# Patient Record
Sex: Female | Born: 1985 | Hispanic: No | Marital: Single | Smoking: Never smoker
Health system: Southern US, Community
[De-identification: ages and names within clinical notes are randomized; demographics above are authoritative.]

## PROBLEM LIST (undated history)

## (undated) DIAGNOSIS — C801 Malignant (primary) neoplasm, unspecified: Secondary | ICD-10-CM

## (undated) DIAGNOSIS — E079 Disorder of thyroid, unspecified: Secondary | ICD-10-CM

---

## 2015-06-12 HISTORY — PX: TOTAL THYROIDECTOMY: SHX2547

## 2015-06-12 HISTORY — PX: THYROIDECTOMY: SHX17

## 2019-02-20 DIAGNOSIS — Z111 Encounter for screening for respiratory tuberculosis: Secondary | ICD-10-CM | POA: Diagnosis not present

## 2019-02-20 DIAGNOSIS — Z23 Encounter for immunization: Secondary | ICD-10-CM | POA: Diagnosis not present

## 2019-04-01 DIAGNOSIS — E890 Postprocedural hypothyroidism: Secondary | ICD-10-CM | POA: Diagnosis not present

## 2019-05-12 DIAGNOSIS — Z0001 Encounter for general adult medical examination with abnormal findings: Secondary | ICD-10-CM | POA: Diagnosis not present

## 2019-06-19 ENCOUNTER — Other Ambulatory Visit: Payer: Self-pay

## 2019-06-23 ENCOUNTER — Ambulatory Visit: Payer: BC Managed Care – PPO | Admitting: Internal Medicine

## 2019-06-23 ENCOUNTER — Ambulatory Visit
Admission: RE | Admit: 2019-06-23 | Discharge: 2019-06-23 | Disposition: A | Discharge status: Home / Self Care | Payer: BC Managed Care – PPO | Source: Ambulatory Visit | Attending: Internal Medicine | Admitting: Internal Medicine

## 2019-06-23 ENCOUNTER — Encounter: Payer: Self-pay | Admitting: Internal Medicine

## 2019-06-23 VITALS — BP 110/68 | HR 78 | Temp 98.8°F | Ht 59.0 in | Wt 105.6 lb

## 2019-06-23 DIAGNOSIS — Z85850 Personal history of malignant neoplasm of thyroid: Secondary | ICD-10-CM

## 2019-06-23 DIAGNOSIS — L680 Hirsutism: Secondary | ICD-10-CM | POA: Diagnosis not present

## 2019-06-23 DIAGNOSIS — E890 Postprocedural hypothyroidism: Secondary | ICD-10-CM

## 2019-06-23 LAB — T4, FREE: Free T4: 0.96 ng/dL (ref 0.60–1.60)

## 2019-06-23 LAB — COMPREHENSIVE METABOLIC PANEL
ALT: 18 U/L (ref 0–35)
AST: 13 U/L (ref 0–37)
Albumin: 4.5 g/dL (ref 3.5–5.2)
Alkaline Phosphatase: 51 U/L (ref 39–117)
BUN: 13 mg/dL (ref 6–23)
CO2: 26 mEq/L (ref 19–32)
Calcium: 8.7 mg/dL (ref 8.4–10.5)
Chloride: 105 mEq/L (ref 96–112)
Creatinine, Ser: 0.62 mg/dL (ref 0.40–1.20)
GFR: 111.52 mL/min (ref >60.00)
Glucose, Bld: 90 mg/dL (ref 70–99)
Potassium: 3.7 mEq/L (ref 3.5–5.1)
Sodium: 138 mEq/L (ref 135–145)
Total Bilirubin: 0.6 mg/dL (ref 0.2–1.2)
Total Protein: 7.6 g/dL (ref 6.0–8.3)

## 2019-06-23 LAB — TSH: TSH: 8.56 u[IU]/mL — ABNORMAL HIGH (ref 0.35–4.50)

## 2019-06-23 NOTE — Progress Notes (Signed)
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 is a 34 y.o. 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   She is S/P total thyroidectomy in [place] in 2017. She had a left thyroid nodule FNA consistent with papillary thyroid carcinoma. Her pathology report showed right papillary thyroid micro-carcinoma , classic and follicular variant 99991111.3x0.2 cm with no capsular invasion. Left lobe showed a papillary thyroid carcinoma , classic variant 1.0x1.0 x0.8 cm . Four L.N removed and were benign.   A whole body scan I -131 showed remnant thyroid bed with abnormal iodine avid lesion is noted in the mediastinum which could be due to physiologic thymic uptake.  She has been living in the [place] the past [place]. 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    She has a normal ultrasound 6 month ago in [place]    Her TSH has been suppressed on the past few readings through  the student center, she was initially on Levothyroxine 100 mcy daily Mon-Friday , and 150 mcg sat and sun. This was reduced to 100 mcg with repeat TSH still low, apparently her dose was reduced to 88 mcg daily but the patient was not aware of this until I called her with the results with a TSH of 8.56 uIU/ml  And realized she has been taking the 88 mcg .  Today she is c/o palpitations, heat intolerance, no diarrhea.   She feels she has chunks in her throat for the past 2 years.    Sisters with  [employer]    HIRSUTISM HISTORY:  Pt with hirsutism for years. Sisters with similar issues. She had laser hair removal ~ 5 yrs ago. But 2 years ago she noted hair regrowth especially over the chin area. The hair is described as thick and dark. She is currently undergoing laser hair removal again but pt states it has not helped this time.  Pt has noted new onset post-coital bleeding .Uses condoms She has never had a pap smear in the past     HISTORY:   Past Medical History: No past medical history on file.  Past Surgical History: The histories are not reviewed yet. Please review them in the "History" navigator section and refresh this [place].   Social History:  reports that she has never smoked. She has never used smokeless tobacco. She reports current alcohol use.  Family History: family history includes Thyroid disease in her sister.   HOME MEDICATIONS: Allergies as of 06/23/2019   Not on File     Medication List       Accurate as of June 23, 2019 11:59 PM. If you have any questions, ask your nurse or doctor.        levothyroxine 100 MCG tablet Commonly known as: SYNTHROID Take 100 mcg by mouth daily.         REVIEW OF SYSTEMS: A comprehensive ROS was conducted with the patient and is negative except as per HPI and below:  Review of Systems  Constitutional:  Positive for malaise/fatigue and weight loss.  Cardiovascular: Positive for palpitations. Negative for chest pain.  Gastrointestinal: Negative for diarrhea and nausea.  Neurological: Positive for tremors.  Psychiatric/Behavioral: Positive for depression. The patient is nervous/anxious.      OBJECTIVE:  VS: BP 110/68 (BP Location: Right Arm, Patient Position: Sitting, Cuff Size: Normal)   Pulse 78   Temp 98.8 F (37.1 C)   Ht 4\' 11"  (1.499 m)   Wt 105 lb 9.6 oz (47.9 kg)   SpO2 98%   BMI 21.33 kg/m    Wt Readings from Last 3 Encounters:  06/23/19 105 lb 9.6 oz (47.9 kg)     EXAM: General: Pt  appears well and is in NAD  Eyes: External eye exam normal without stare, lid lag or exophthalmos.  EOM intact.    Neck: General: Supple without adenopathy. Thyroid: Thyroid surgically removed.  Lungs: Clear with good BS bilat with no rales, rhonchi, or wheezes  Heart: Auscultation: RRR.  Abdomen: Normoactive bowel sounds, soft, nontender, without masses or organomegaly palpable  Extremities:  BL LE: No pretibial edema normal ROM and strength.  Skin: Hair: Thick dark stubbles around the chin, and beard area. Rest unable to assess due to cosmetic hair removal Skin Inspection: No rashes Skin Palpation: Skin temperature, texture, and thickness normal to palpation  Neuro: Cranial nerves: II - XII grossly intact  Motor: Normal strength throughout DTRs: 2+ and symmetric in UE without delay in relaxation phase  Mental Status: Judgment, insight: Intact Orientation: Oriented to time, place, and person Mood and affect: No depression, anxiety, or agitation     DATA REVIEWED: Results for [place], [facility] (MRN [id number]) as of 06/25/2019 13:09  Ref. Range 06/23/2019 14:48  Sodium Latest Ref Range: 135 - 145 mEq/L 138  Potassium Latest Ref Range: 3.5 - 5.1 mEq/L 3.7  Chloride Latest Ref Range: 96 - 112 mEq/L 105  CO2 Latest Ref Range: 19 - 32 mEq/L 26  Glucose Latest Ref Range: 70 - 99 mg/dL 90  BUN Latest Ref Range: 6 - 23 mg/dL 13  Creatinine Latest Ref Range: 0.40 - 1.20 mg/dL 0.62  Calcium Latest Ref Range: 8.4 - 10.5 mg/dL 8.7  Alkaline Phosphatase Latest Ref Range: 39 - 117 U/L 51  Albumin Latest Ref Range: 3.5 - 5.2 g/dL 4.5  AST Latest Ref Range: 0 - 37 U/L 13  ALT Latest Ref Range: 0 - 35 U/L 18  Total Protein Latest Ref Range: 6.0 - 8.3 g/dL 7.6  Total Bilirubin Latest Ref Range: 0.2 - 1.2 mg/dL 0.6  GFR Latest Ref Range: >60.00 mL/min 111.52  Comment Unknown Pend  TSH Latest Ref Range: 0.35 - 4.50 uIU/mL 8.56 (H)  T4,Free(Direct) Latest Ref Range: 0.60 - 1.60 ng/dL 0.96   Thyroglobulin Latest Units: ng/mL <0.1 (L)  Thyroglobulin Ab Latest Ref Range: < or = 1 IU/mL <1     04/01/2019  TSH 0.26 uIU/mL  FT4  1.5 ng/dL     Right papillary microcarcinoma 123456 mm, follicular variant. Left lobe papillary carcinoma classical variant 10x10x8 mm (T1a, N0,MX)   Thyroid ULtrasound 06/23/2019  FINDINGS: Previous total thyroidectomy. _________________________________________________________  Estimated total number of nodules >/= 1 cm: 0  Number of spongiform nodules >/=  2 cm not described below (TR1): 0  Number of mixed cystic and solid nodules >/= 1.5 cm not described below ([place]): 0  _________________________________________________________  No thyroid bed abnormality demonstrated by ultrasound. No residual thyroid tissue or nodule. Negative for adenopathy.  IMPRESSION: Status post total thyroidectomy.  No residual or significant finding by ultrasound.  ASSESSMENT/PLAN/RECOMMENDATIONS:   1. 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 Postsurgical Hypothyroidism :      Pt with multiple symptoms ,her symptoms are mainly with  Hyperthyroidism  but biochemically she is hypothyroid .  - Pt educated extensively on the correct way to take levothyroxine (first thing in the morning with water, 30 minutes before eating or taking other medications). - Pt encouraged to double dose the following day if she were to miss a dose given long half-life of levothyroxine. - She initially  Though she was on Levothyroxine 100 mcg but when she got home, she realized she has been taking 88 mcg.   - Will restart Levothyroxine 100 mcg daily  - Recheck  in 8 weeks    3.Hirsutism :    - Will proceed with AM labs of testosterone and 17-OH progesterone sometime next week.  - She was also advised that the first line of treatment of hirsutism is COC ( she was on them in the past for ~10 yrs for hirsutism)  - I explained to her that she needs to have a pap smear and have postcoital bleeding checked out prior to starting her on OCp's.    F/U in 6 months   Signed electronically by: Mack Guise, MD  [facility]  [facility] [address] [place],  [address] Phone: [phone] FAX: [phone]   CC: Keith Rake, MD [address] [place] [address] Phone: [phone] Fax: [phone]   Return to Endocrinology clinic as below: Future Appointments  Date Time Provider [place]  07/03/2019  8:45 AM [facility] None  08/21/2019  8:30 AM [facility] None  12/21/2019  9:10 AM Aliannah Holstrom, Melanie Crazier, MD [facility] None

## 2019-06-23 NOTE — Patient Instructions (Signed)
-   Stop Levothyroxine 100 mcg daily  - Start Levothyroxine 88 mcg daily   You are on levothyroxine - which is your thyroid hormone supplement. You MUST take this consistently.  You should take this first thing in the morning on an empty stomach with water. You should not take it with other medications. Wait 61min to 1hr prior to eating. If you are taking any vitamins - please take these in the evening.   If you miss a dose, please take your missed dose the following day (double the dose for that day). You should have a pill box for ONLY levothyroxine on your bedside table to help you remember to take your medications.   - We will set you up for thyroid bed ultrasound

## 2019-06-24 ENCOUNTER — Encounter: Payer: Self-pay | Admitting: Internal Medicine

## 2019-06-24 ENCOUNTER — Telehealth: Payer: Self-pay | Admitting: Internal Medicine

## 2019-06-24 ENCOUNTER — Telehealth: Payer: Self-pay

## 2019-06-24 NOTE — Telephone Encounter (Signed)
Made in error

## 2019-06-24 NOTE — Telephone Encounter (Signed)
Student health calling in for office visit notes for visit on 06/23/2019  Fax number [phone]

## 2019-06-24 NOTE — Telephone Encounter (Signed)
Dr. Kelton Pillar has not signed the visit yet, she is waiting on lab results. I will fax the notes once she has signed off on the visit.

## 2019-06-24 NOTE — Telephone Encounter (Signed)
Tashia,   Please call in levothyroxine 100 mcg to the student pharmacy at [facility]. I could;t find it in the system    Their # [phone]   # 60 with 2 refills   Thanks   Abby Nena Jordan, MD  [facility]  [facility] [address] [place], [place] [address] Phone: [phone] FAX: [phone]

## 2019-06-24 NOTE — Telephone Encounter (Signed)
Patient called in returning a message wasn't sure who gave her a call I didn't see a message in chart    Please advise

## 2019-06-24 NOTE — Telephone Encounter (Signed)
Please advise 

## 2019-06-25 ENCOUNTER — Encounter: Payer: Self-pay | Admitting: Internal Medicine

## 2019-06-25 ENCOUNTER — Other Ambulatory Visit: Payer: Self-pay

## 2019-06-25 DIAGNOSIS — Z85850 Personal history of malignant neoplasm of thyroid: Secondary | ICD-10-CM | POA: Insufficient documentation

## 2019-06-25 DIAGNOSIS — E890 Postprocedural hypothyroidism: Secondary | ICD-10-CM | POA: Insufficient documentation

## 2019-06-25 DIAGNOSIS — L680 Hirsutism: Secondary | ICD-10-CM | POA: Insufficient documentation

## 2019-06-25 LAB — THYROGLOBULIN LEVEL: Thyroglobulin: <0.1 ng/mL — ABNORMAL LOW

## 2019-06-25 LAB — THYROGLOBULIN ANTIBODY: Thyroglobulin Ab: <1 IU/mL (ref <=1)

## 2019-06-25 MED ORDER — LEVOTHYROXINE SODIUM 100 MCG PO TABS: 100.0000 ug | ORAL_TABLET | Freq: Every day | ORAL | 2 refills | Status: DC

## 2019-06-25 NOTE — Telephone Encounter (Signed)
Added pharmacy to pt profile and sent in medication thru e-scribe.

## 2019-07-03 ENCOUNTER — Other Ambulatory Visit: Payer: BC Managed Care – PPO

## 2019-07-07 ENCOUNTER — Telehealth: Payer: Self-pay

## 2019-07-07 NOTE — Telephone Encounter (Signed)
Heather from [facility] (the referring office) is requesting notes be faxed to them from appointment on 06/23/19-please fax to [phone]

## 2019-07-07 NOTE — Telephone Encounter (Signed)
Requested notes faxed

## 2019-07-07 NOTE — Telephone Encounter (Signed)
Attempted to fax notes to # provided and it failed 4 times throughout the day. Need a valid fax # to send notes to.

## 2019-08-21 ENCOUNTER — Other Ambulatory Visit: Payer: BC Managed Care – PPO

## 2019-08-26 ENCOUNTER — Other Ambulatory Visit: Payer: Self-pay

## 2019-08-26 ENCOUNTER — Other Ambulatory Visit (INDEPENDENT_AMBULATORY_CARE_PROVIDER_SITE_OTHER): Payer: BC Managed Care – PPO

## 2019-08-26 DIAGNOSIS — Z85850 Personal history of malignant neoplasm of thyroid: Secondary | ICD-10-CM

## 2019-09-01 LAB — ESTRADIOL: Estradiol: 208 pg/mL

## 2019-09-01 LAB — 17-HYDROXYPROGESTERONE: 17-OH-Progesterone, LC/MS/MS: 344 ng/dL

## 2019-09-01 LAB — HCG, SERUM, QUALITATIVE: Preg, Serum: NEGATIVE

## 2019-09-01 LAB — TESTOSTERONE, TOTAL, LC/MS/MS: Testosterone, Total, LC-MS-MS: 41 ng/dL (ref 2–45)

## 2019-09-01 LAB — PROLACTIN: Prolactin: 15.6 ng/mL

## 2019-09-01 LAB — FSH/LH
FSH: 5.4 m[IU]/mL
LH: 26.3 m[IU]/mL

## 2019-09-07 ENCOUNTER — Telehealth: Payer: Self-pay | Admitting: Internal Medicine

## 2019-09-07 NOTE — Telephone Encounter (Signed)
MEDICATION: levothyroxine  PHARMACY:  [facility], [place] Phone:  [phone]  Fax:  [phone]      IS THIS A 90 DAY SUPPLY : yes  IS PATIENT OUT OF MEDICATION: yes  IF NOT; HOW MUCH IS LEFT:   LAST APPOINTMENT DATE: @1 /26/2021  NEXT APPOINTMENT DATE:@7 /19/2021  DO WE HAVE YOUR PERMISSION TO LEAVE A DETAILED MESSAGE: yes  OTHER COMMENTS:    **Let patient know to contact pharmacy at the end of the day to make sure medication is ready. **  ** Please notify patient to allow 48-72 hours to process**  **Encourage patient to contact the pharmacy for refills or they can request refills through [facility]**

## 2019-09-08 ENCOUNTER — Other Ambulatory Visit: Payer: Self-pay

## 2019-09-08 DIAGNOSIS — Z85850 Personal history of malignant neoplasm of thyroid: Secondary | ICD-10-CM

## 2019-09-08 DIAGNOSIS — E890 Postprocedural hypothyroidism: Secondary | ICD-10-CM

## 2019-09-08 MED ORDER — LEVOTHYROXINE SODIUM 100 MCG PO TABS: 100.0000 ug | ORAL_TABLET | Freq: Every day | ORAL | 2 refills | Status: DC

## 2019-09-08 NOTE — Progress Notes (Signed)
Refill sent to pharmacy.   

## 2019-11-03 ENCOUNTER — Telehealth: Payer: Self-pay | Admitting: Internal Medicine

## 2019-11-03 ENCOUNTER — Other Ambulatory Visit: Payer: Self-pay

## 2019-11-03 DIAGNOSIS — Z85850 Personal history of malignant neoplasm of thyroid: Secondary | ICD-10-CM

## 2019-11-03 DIAGNOSIS — E890 Postprocedural hypothyroidism: Secondary | ICD-10-CM

## 2019-11-03 MED ORDER — LEVOTHYROXINE SODIUM 100 MCG PO TABS: 100.0000 ug | ORAL_TABLET | Freq: Every day | ORAL | 2 refills | Status: DC

## 2019-11-03 NOTE — Telephone Encounter (Signed)
sent 

## 2019-11-03 NOTE — Telephone Encounter (Signed)
Medication Refill Request  Did you call your pharmacy and request this refill first? Yes  . If patient has not contacted pharmacy first, instruct them to do so for future refills.  . Remind them that contacting the pharmacy for their refill is the quickest method to get the refill.  . Refill policy also stated that it will take anywhere between 24-72 hours to receive the refill.    Name of medication? levothyroxine  Is this a 90 day supply? yes  Name and location of pharmacy?   [place], [place] Phone:  [phone]  Fax:  [phone]

## 2019-12-21 ENCOUNTER — Ambulatory Visit: Payer: BC Managed Care – PPO | Admitting: Internal Medicine

## 2019-12-28 ENCOUNTER — Encounter: Payer: Self-pay | Admitting: Internal Medicine

## 2019-12-28 ENCOUNTER — Other Ambulatory Visit: Payer: Self-pay

## 2019-12-28 ENCOUNTER — Ambulatory Visit (INDEPENDENT_AMBULATORY_CARE_PROVIDER_SITE_OTHER): Payer: BC Managed Care – PPO | Admitting: Internal Medicine

## 2019-12-28 VITALS — BP 116/68 | HR 68 | Ht 59.0 in | Wt 105.6 lb

## 2019-12-28 DIAGNOSIS — Z85850 Personal history of malignant neoplasm of thyroid: Secondary | ICD-10-CM | POA: Diagnosis not present

## 2019-12-28 DIAGNOSIS — E890 Postprocedural hypothyroidism: Secondary | ICD-10-CM | POA: Diagnosis not present

## 2019-12-28 DIAGNOSIS — L680 Hirsutism: Secondary | ICD-10-CM | POA: Diagnosis not present

## 2019-12-28 LAB — TSH: TSH: 8.81 u[IU]/mL — ABNORMAL HIGH (ref 0.35–4.50)

## 2019-12-28 MED ORDER — LEVOTHYROXINE SODIUM 112 MCG PO TABS
112.0000 ug | ORAL_TABLET | Freq: Every day | ORAL | 2 refills | Status: DC
Start: 2019-12-28 — End: 2020-06-02

## 2019-12-28 NOTE — Progress Notes (Signed)
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 She had a left thyroid nodule FNA consistent with papillary thyroid carcinoma. Her pathology report showed right papillary thyroid micro-carcinoma , classic and follicular variant 3.4V4.3x0.2 cm with no capsular invasion. Left lobe showed a papillary thyroid carcinoma , classic variant 1.0x1.0 x0.8 cm . Four L.N removed and were benign.   A whole body scan I -131 showed remnant thyroid bed with abnormal iodine avid lesion is noted in the mediastinum which could be due to physiologic thymic uptake.  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 Sisters with similar issues. She had laser hair removal ~ 5 yrs ago. But 2 years ago she noted hair regrowth especially over the chin area. The hair is described as thick and dark. She is currently undergoing laser hair removal again but pt states it has not helped this time.  Pt has noted new onset post-coital bleeding .Uses condoms She has never had a pap smear   SUBJECTIVE:    Today (12/28/2019):  Toni Alvarez is here for a follow up on postsurgical hypothyroidism .  Denies local neck symptoms    Weight is stable  Endorses palpitations, worse in am  No diarrhea    She has an appointment with Gyn in 01/2020    ROS:  As per HPI.   HISTORY:   Past Medical History: No past medical history on file.  Past Surgical History:   Social History:  reports that she has never smoked. She has never used smokeless tobacco. She reports current alcohol use. No history on file for drug use. Family History:  Family History  Problem Relation Age of Onset  . Thyroid disease Sister      HOME MEDICATIONS: Allergies as of 12/28/2019   No Known Allergies     Medication List       Accurate as of December 28, 2019  9:52 AM. If you have any questions, ask your nurse or doctor.        levothyroxine 100 MCG tablet Commonly known as: SYNTHROID Take 1 tablet (100 mcg total) by mouth daily.         OBJECTIVE:   PHYSICAL EXAM: VS: BP 116/68 (BP Location: Left Arm, Patient Position: Sitting, Cuff Size: Normal)   Pulse 68   Ht 4\' 11"  (1.499 m)   Wt 105 lb 9.6 oz (47.9 kg)   LMP 11/30/2019 (Exact Date)   SpO2 99%   BMI 21.33 kg/m    EXAM: General: Pt appears well and is in NAD  Neck: General: Supple without adenopathy. Thyroid:   No goiter or nodules appreciated.   Lungs: Clear with good BS bilat with no rales, rhonchi, or wheezes  Heart: Auscultation: RRR.  Abdomen: Normoactive bowel sounds, soft, nontender, without masses or organomegaly palpable  Extremities:  BL LE: No pretibial edema normal ROM and strength.  Mental Status: Judgment, insight: Intact Orientation: Oriented to time, place, and person Mood and affect: No depression, anxiety, or agitation     DATA REVIEWED: Results for Toni, Alvarez Toni Alvarez (MRN [id number]) as of 12/28/2019 14:29  Ref. Range 06/23/2019 14:48 12/28/2019 10:06  TSH Latest Ref Range: 0.35 - 4.50 uIU/mL 8.56 (H) 8.81 (H)    Results for Toni, Alvarez Toni Alvarez (MRN [id number]) as of 12/28/2019 07:33  Ref. Range 06/23/2019 14:48 08/26/2019 09:38  LH Latest Units: mIU/mL  26.3  FSH Latest Units: mIU/mL  5.4   Prolactin Latest Units: ng/mL  15.6  Glucose Latest Ref Range: 70 - 99 mg/dL 90   Estradiol Latest Units: pg/mL  208  Preg, Serum Unknown  NEGATIVE  Testosterone, Total, LC-MS-MS Latest Ref Range: 2 - 45 ng/dL  41  17-OH-Progesterone, LC/MS/MS Latest Ref Range:  ng/dL  344  TSH Latest Ref Range: 0.35 - 4.50 uIU/mL 8.56 (H)   T4,Free(Direct) Latest Ref Range: 0.60 - 1.60 ng/dL 0.96   Thyroglobulin Latest Units: ng/mL <0.1 (L)   Thyroglobulin Ab Latest Ref Range: < or = 1 IU/mL <1       Thyroid ultrasound 06/23/2019 Status post total thyroidectomy. No residual or significant finding by ultrasound.  ASSESSMENT / PLAN / RECOMMENDATIONS:   1. 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 Postsurgical Hypothyroidism :    - Pt educated extensively on the correct way to take levothyroxine (first thing in the morning with water, 30 minutes before eating or taking other medications). - Pt encouraged to double dose the following day if she were to miss a dose given long half-life of levothyroxine. - TSH above goal , will adjust levothyroxine as below     - Increase Levothyroxine to 112  mcg daily     3.Idiopathic Hirsutism :   - Hormonal profile normal.  - Pt to continue laser hair removal    4. Palpitations:   - Not related to thyroid.  - Could be anxiety - Discussed avoiding caffeine     F/U in 6 months   Signed electronically by: Mack Guise, MD  [facility] Endocrinology  [facility] [address] [place], [place] [address] Phone: [phone] FAX: [phone]      CC: Keith Rake, MD [place]. [place] [address] Phone: [phone]  Fax: [phone]   Return to Endocrinology clinic as below: No future appointments.

## 2019-12-28 NOTE — Patient Instructions (Signed)

## 2019-12-29 LAB — THYROGLOBULIN LEVEL: Thyroglobulin: <0.1 ng/mL — ABNORMAL LOW

## 2019-12-29 LAB — THYROGLOBULIN ANTIBODY: Thyroglobulin Ab: <1 IU/mL (ref <=1)

## 2020-01-13 DIAGNOSIS — Z23 Encounter for immunization: Secondary | ICD-10-CM | POA: Diagnosis not present

## 2020-06-02 ENCOUNTER — Other Ambulatory Visit: Payer: Self-pay | Admitting: Internal Medicine

## 2020-06-30 ENCOUNTER — Other Ambulatory Visit: Payer: Self-pay

## 2020-07-04 ENCOUNTER — Other Ambulatory Visit: Payer: Self-pay

## 2020-07-04 ENCOUNTER — Ambulatory Visit (INDEPENDENT_AMBULATORY_CARE_PROVIDER_SITE_OTHER): Payer: BC Managed Care – PPO | Admitting: Internal Medicine

## 2020-07-04 ENCOUNTER — Encounter: Payer: Self-pay | Admitting: Internal Medicine

## 2020-07-04 VITALS — BP 114/70 | HR 82 | Ht 59.0 in | Wt 109.0 lb

## 2020-07-04 DIAGNOSIS — E890 Postprocedural hypothyroidism: Secondary | ICD-10-CM

## 2020-07-04 DIAGNOSIS — Z85850 Personal history of malignant neoplasm of thyroid: Secondary | ICD-10-CM

## 2020-07-04 LAB — TSH: TSH: 0.06 u[IU]/mL — ABNORMAL LOW (ref 0.35–4.50)

## 2020-07-04 NOTE — Patient Instructions (Signed)

## 2020-07-04 NOTE — Progress Notes (Signed)
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 She had a left thyroid nodule FNA consistent with papillary thyroid carcinoma. Her pathology report showed right papillary thyroid micro-carcinoma , classic and follicular variant 4.0J8.3x0.2 cm with no capsular invasion. Left lobe showed a papillary thyroid carcinoma , classic variant 1.0x1.0 x0.8 cm . Four L.N removed and were benign.   A whole body scan I -131 showed remnant thyroid bed with abnormal iodine avid lesion is noted in the mediastinum which could be due to physiologic thymic uptake.  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 Sisters with similar issues. She had laser hair removal a few years ago with regrowth of hair. LH, FSH, prolactin, testosterone and 17-OH progesterone were all normal.   SUBJECTIVE:    Today (07/04/2020):  Toni Alvarez is here for a follow up on postsurgical hypothyroidism .  Denies local neck symptoms    Weight is stable  No diarrhea  Denies local neck symptoms  LMP end of 05/2020- regular   She has an appointment with [facility] in 01/2020 but did not make it, has an upcoming appointment next week     HISTORY:   Past Medical History: No past medical history on  file.  Past Surgical History:   Social History:  reports that she has never smoked. She has never used smokeless tobacco. She reports current alcohol use. No history on file for drug use. Family History:  Family History  Problem Relation Age of Onset  . Thyroid disease Sister      HOME MEDICATIONS: Allergies as of 07/04/2020   No Known Allergies     Medication List       Accurate as of July 04, 2020  3:56 PM. If you have any questions, ask your nurse or doctor.        levothyroxine 112 MCG tablet Commonly known as: SYNTHROID TAKE 1 TABLET BY MOUTH EVERY DAY         OBJECTIVE:   PHYSICAL EXAM: VS: BP 114/70   Pulse 82   Ht 4\' 11"  (1.499 m)   Wt 109 lb (49.4 kg)   LMP 06/12/2020   SpO2 98%   BMI 22.02 kg/m    EXAM: General: Pt appears well and is in NAD  Neck: General: Supple without adenopathy. Thyroid:   No goiter or nodules appreciated.   Lungs: Clear with good BS bilat with no rales, rhonchi, or wheezes  Heart: Auscultation: RRR.  Abdomen: Normoactive bowel sounds, soft, nontender, without masses or organomegaly palpable  Extremities:  BL LE: No pretibial edema normal ROM and strength.  Mental Status: Judgment, insight: Intact Orientation: Oriented to time, place, and person Mood and affect: No depression, anxiety, or agitation    DATA REVIEWED: Results for Toni Alvarez (MRN [id number]) as of 07/04/2020  16:00  Ref. Range 06/23/2019 14:48 08/26/2019 09:38 12/28/2019 10:06 07/04/2020 09:43  TSH Latest Ref Range: 0.35 - 4.50 uIU/mL 8.56 (H)  8.81 (H) 0.06 (L)     Thyroid ultrasound 06/23/2019 Status post total thyroidectomy. No residual or significant finding by ultrasound.  ASSESSMENT / PLAN / RECOMMENDATIONS:   1. 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 Postsurgical Hypothyroidism :    - Pt educated extensively on the correct way to take levothyroxine (first thing in the morning with water, 30 minutes before eating or taking other medications). - Pt encouraged to double dose the following day if she were to miss a dose given long half-life of levothyroxine. - TSH  Is low , will adjust levothyroxine as below     Levothyroxine 112  mcg , half on Sundays and 1 tablet rest of the week       F/U in 6 months  Labs in 8 weeks   Addendum: discussed labs with the pt on 07/04/2020 at 1600   Signed electronically by: Toni Guise, MD  [facility] Endocrin[facility]  [facility] [address] [place], [place] [address] Phone: [phone] FAX: [phone]      CC: Toni Rake, MD [address]. [place] [address] Phone: [phone]  Fax: [phone]   Return to Endocrinology clinic as below: Future Appointments  Date Time Provider [place]  08/31/2020  8:30 AM [facility] None  01/02/2021  9:10 AM Toni Alvarez, Toni Crazier, MD [facility] None

## 2020-07-05 LAB — THYROGLOBULIN ANTIBODY: Thyroglobulin Ab: <1 IU/mL (ref <=1)

## 2020-07-05 LAB — THYROGLOBULIN LEVEL: Thyroglobulin: <0.1 ng/mL — ABNORMAL LOW

## 2020-08-31 ENCOUNTER — Other Ambulatory Visit: Payer: Self-pay

## 2020-08-31 ENCOUNTER — Other Ambulatory Visit (INDEPENDENT_AMBULATORY_CARE_PROVIDER_SITE_OTHER): Payer: BC Managed Care – PPO

## 2020-08-31 DIAGNOSIS — E890 Postprocedural hypothyroidism: Secondary | ICD-10-CM

## 2020-08-31 LAB — TSH: TSH: 0.44 u[IU]/mL (ref 0.35–4.50)

## 2020-12-02 ENCOUNTER — Other Ambulatory Visit: Payer: Self-pay | Admitting: Internal Medicine

## 2021-01-02 ENCOUNTER — Other Ambulatory Visit: Payer: Self-pay

## 2021-01-02 ENCOUNTER — Ambulatory Visit (INDEPENDENT_AMBULATORY_CARE_PROVIDER_SITE_OTHER): Payer: BC Managed Care – PPO | Admitting: Internal Medicine

## 2021-01-02 ENCOUNTER — Encounter: Payer: Self-pay | Admitting: Internal Medicine

## 2021-01-02 VITALS — BP 110/68 | HR 90 | Ht 59.0 in | Wt 110.4 lb

## 2021-01-02 DIAGNOSIS — Z85850 Personal history of malignant neoplasm of thyroid: Secondary | ICD-10-CM | POA: Diagnosis not present

## 2021-01-02 DIAGNOSIS — E890 Postprocedural hypothyroidism: Secondary | ICD-10-CM

## 2021-01-02 NOTE — Progress Notes (Addendum)
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 is a 35 y.o., female with a past medical history of thyroid cancer , S/P total thyroidectomy . 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 She had a left thyroid nodule FNA consistent with papillary thyroid carcinoma. Her pathology report showed right papillary thyroid micro-carcinoma , classic and follicular variant 99991111.3x0.2 cm with no capsular invasion. Left lobe showed a papillary thyroid carcinoma , classic variant 1.0x1.0 x0.8 cm . Four L.N removed and were benign.    A whole body scan I -131 showed remnant thyroid bed with abnormal iodine avid lesion is noted in the mediastinum which could be due to physiologic thymic uptake.   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 Sisters with similar issues. She had laser hair removal a few years ago with regrowth of hair. LH, FSH, prolactin, testosterone and 17-OH progesterone were all normal.   SUBJECTIVE:    Today (01/02/2021):  [name] is here for a follow up on postsurgical hypothyroidism .  Denies local neck symptoms    Weight is stable  No diarrhea or constipation  Has occasional palpitations  Has occasional neck pain but no swelling  LMP end of 11/19/2020- regular   She is having anxiety issues that sometimes interruptus her sleep   Levothyroxine 112  mcg , half on Sundays and 1  tablet rest of the week     HISTORY:  Past Medical History: No past medical history on file. Past Surgical History:  Social History:  reports that she has never smoked. She has never used smokeless tobacco. She reports current alcohol use. No history on file for drug use. Family History:  Family History  Problem Relation Age of Onset   Thyroid disease Sister      HOME MEDICATIONS: Allergies as of 01/02/2021   No Known Allergies      Medication List        Accurate as of January 02, 2021  9:23 AM. If you have any questions, ask your nurse or doctor.          levothyroxine 112 MCG tablet Commonly known as: SYNTHROID TAKE 1 TABLET BY MOUTH EVERY DAY          OBJECTIVE:   PHYSICAL EXAM: VS: BP 110/68 (BP Location: Left Arm, Patient Position: Sitting, Cuff Size: Normal)   Pulse 90   Ht '4\' 11"'$  (1.499 m)   Wt 110 lb 6.4 oz (50.1 kg)   SpO2 99%   BMI 22.30 kg/m    EXAM: General: Pt appears well and is in NAD  Neck: General: Supple without adenopathy. Thyroid:   No goiter or nodules appreciated.   Lungs: Clear with good BS bilat with no rales, rhonchi, or wheezes  Heart: Auscultation: RRR.  Abdomen: Normoactive bowel sounds, soft, nontender, without masses or organomegaly palpable  Extremities:  BL LE: No pretibial edema normal ROM and strength.  Mental Status: Judgment, insight: Intact  Orientation: Oriented to time, place, and person Mood and affect: No depression, anxiety, or agitation    DATA REVIEWED: 01/02/2021   TSH 0.22 uIU/mL  Tg < 0.1     Thyroid ultrasound 06/23/2019 Status post total thyroidectomy. No residual or significant finding by ultrasound.  ASSESSMENT / PLAN / RECOMMENDATIONS:   Hx of  Papillary thyroid carcinoma: (T1a, N0, Mx ) Stage I      - Pt with excellent response to treatment  - She is at a low risk for recurrence  - Tg and Tg Ab negative in the past  - TSH goal 0.5-2.0 uIU/mL  - Repeat thyroid bed ultrasound is  negative 2021 will repeat this year  - She would [facility], orders were provided today    Medications :  Continue Levothyroxine 112 mcg , half a tablet on sundays and 1 tablet rest of the week      2. Postsurgical Hypothyroidism :      - She is having anxiety and palpitations, that she questions if related to thyroid, will check TSH and proceed from there  - She understands that if her TSH is normal, she will need to check with her PCP about treatment for anxiety     Stop Levothyroxine 112  mcg  Start Levothyroxine 100 mcg daily       F/U in 1 yr   Addendum: [facility] uIu[facility]Ml , will reduce Levothyroxine to 100 mcg daily   Signed electronically by: Mack Guise, MD  [facility]  [address] [place], [place] [address] Phone: [phone] FAX: [phone]      CC: Keith Rake, MD [address] [place] [address] Phone: [phone]  Fax: [phone]   Return to Endocrinology clinic as below: No future appointments.

## 2021-01-03 ENCOUNTER — Encounter: Payer: Self-pay | Admitting: Internal Medicine

## 2021-01-03 MED ORDER — LEVOTHYROXINE SODIUM 100 MCG PO TABS: 100.0000 ug | ORAL_TABLET | Freq: Every day | ORAL | 3 refills | Status: DC

## 2021-01-03 NOTE — Addendum Note (Signed)
Addended by: Dorita Sciara on: 01/03/2021 01:39 PM   Modules accepted: Orders

## 2021-03-01 ENCOUNTER — Encounter: Payer: Self-pay | Admitting: Internal Medicine

## 2021-06-16 IMAGING — US US THYROID
1 series · 14 of 19 positions shown · non-contrast
Comparison: None.

CLINICAL DATA: History of papillary thyroid carcinoma. Status post
thyroidectomy.

EXAM:
THYROID ULTRASOUND
TECHNIQUE: Ultrasound examination of the thyroid gland and adjacent soft
tissues was performed.

[Series 1: us thyroid · 0.04mm/px · 14 of 19 slices shown]
[im 1/19]
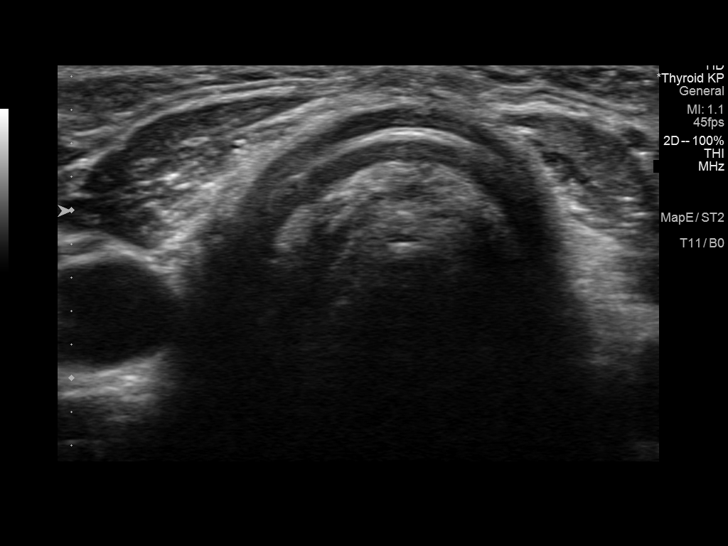
[im 3/19]
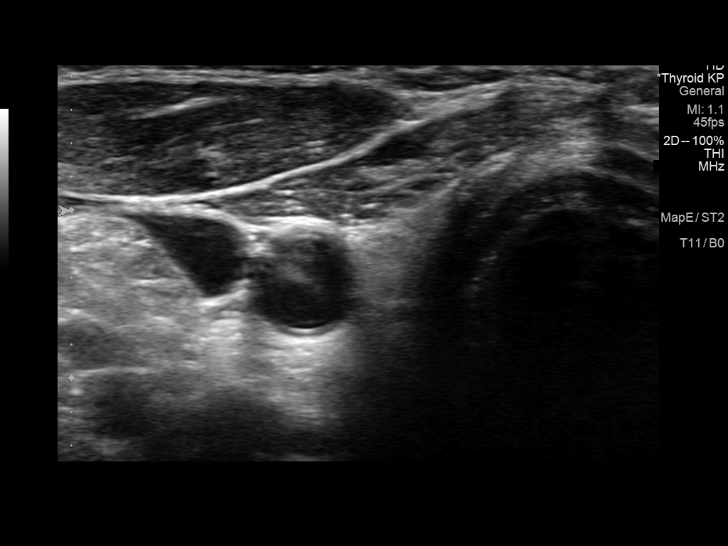
[im 4/19]
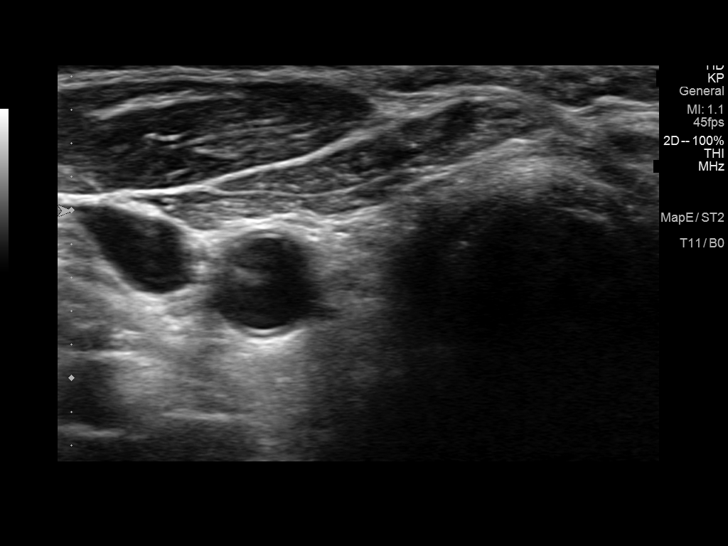
[im 5/19]
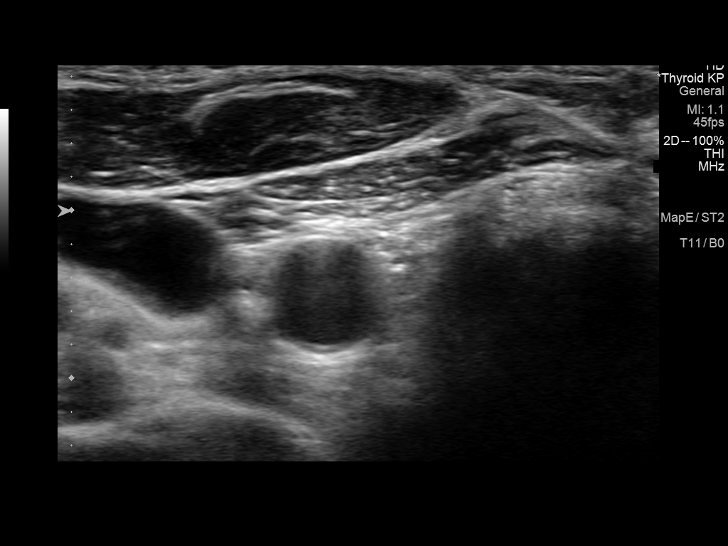
[im 7/19]
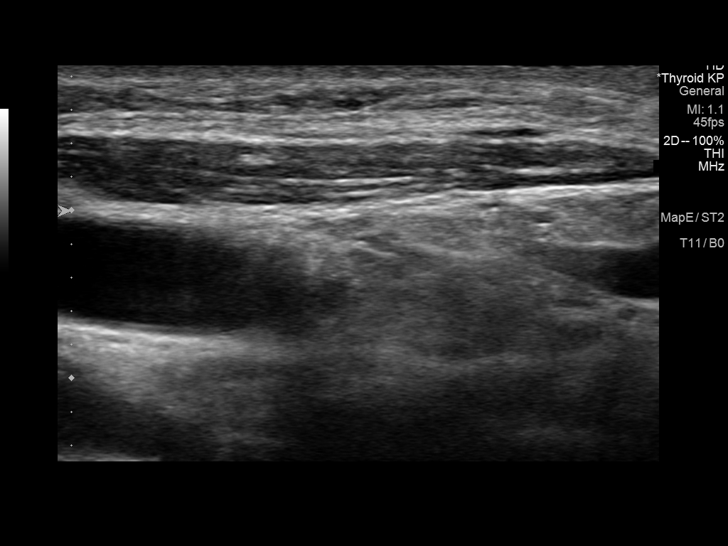
[im 8/19]
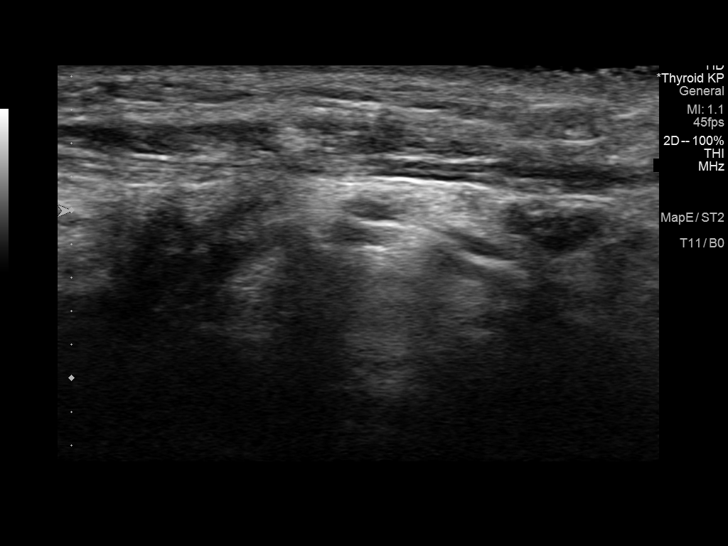
[im 9/19]
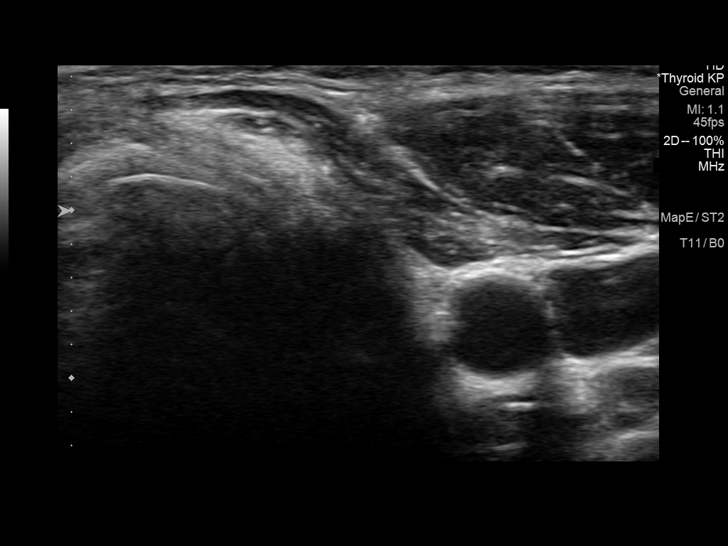
[im 11/19]
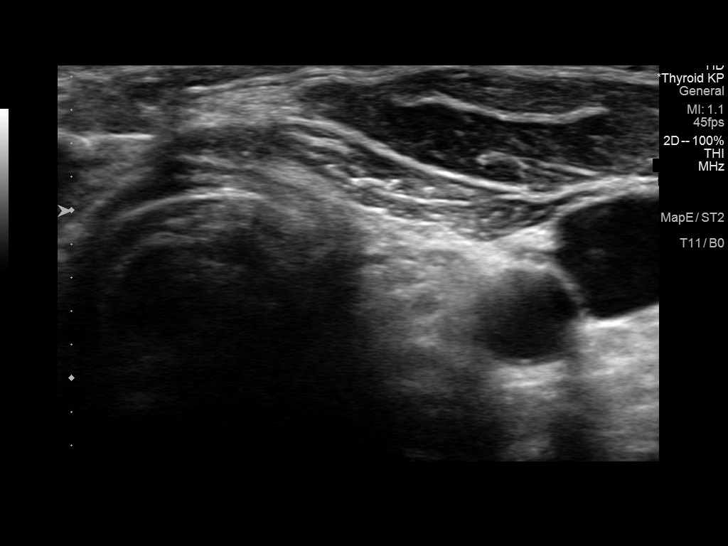
[im 12/19]
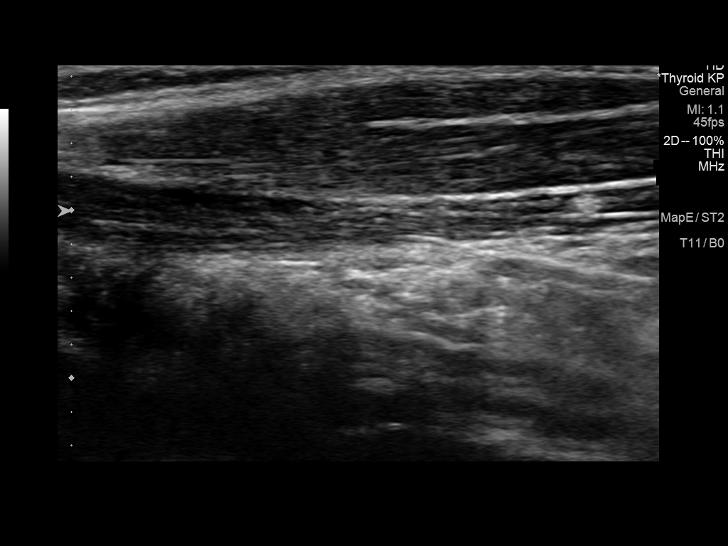
[im 13/19]
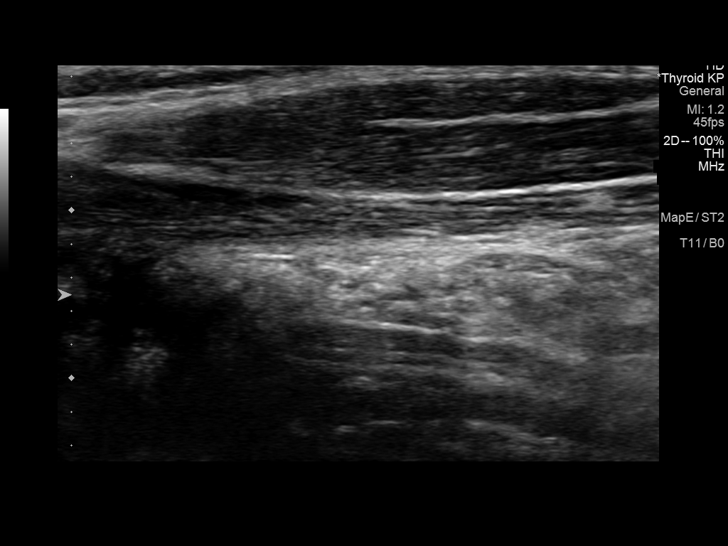
[im 15/19]
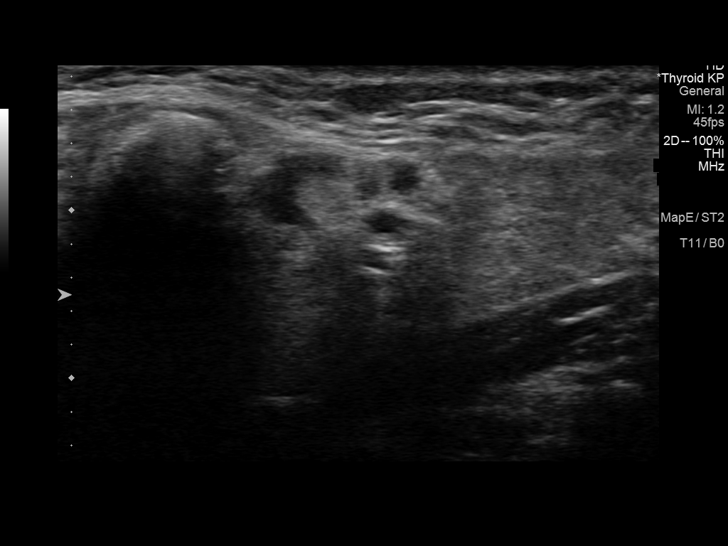
[im 16/19]
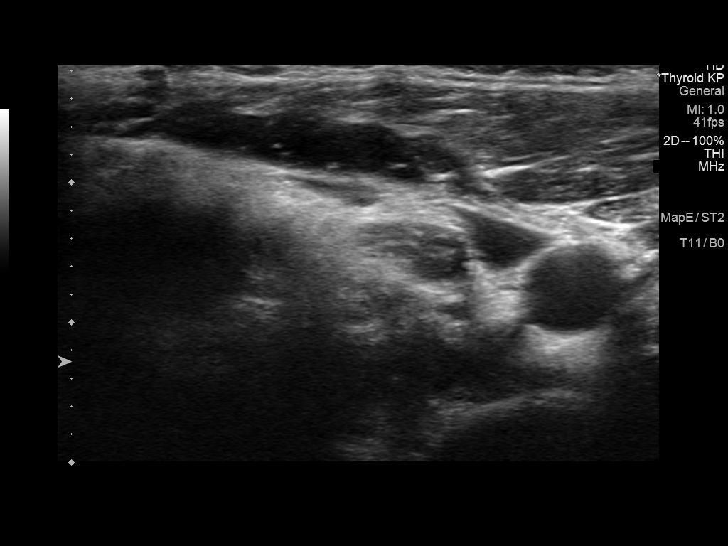
[im 17/19]
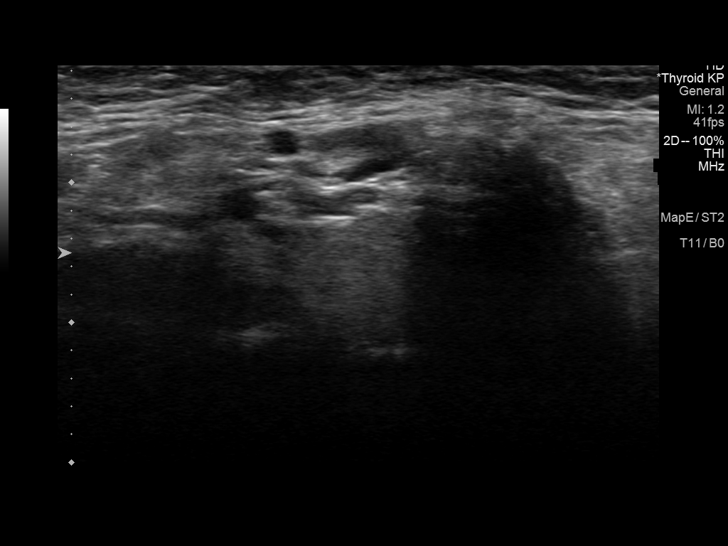
[im 19/19]
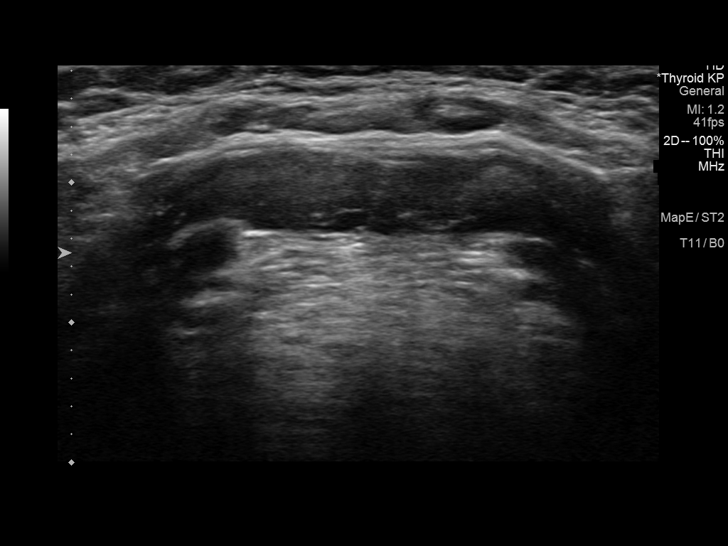

[14 of 19 positions shown; findings below may reference images not displayed]

FINDINGS: Previous total thyroidectomy.
_________________________________________________________

Estimated total number of nodules >/= 1 cm: 0

Number of spongiform nodules >/=  2 cm not described below (TR1): 0

Number of mixed cystic and solid nodules >/= 1.5 cm not described
below (TR2): 0

_________________________________________________________

No thyroid bed abnormality demonstrated by ultrasound. No residual
thyroid tissue or nodule. Negative for adenopathy.
IMPRESSION: Status post total thyroidectomy. No residual or significant finding
by ultrasound.

The above is in keeping with the ACR TI-RADS recommendations - [HOSPITAL] 9111;[DATE].

## 2021-07-31 ENCOUNTER — Telehealth: Payer: Self-pay

## 2021-07-31 NOTE — Telephone Encounter (Signed)
Vm left and my chart message sent to patient

## 2021-07-31 NOTE — Telephone Encounter (Signed)
Patient had some testing done and the results say she a blood clot on  her lymph node. She wants to know if anything needs to be done. She is having some pain in the area where her lymph nodes are.

## 2021-09-11 ENCOUNTER — Other Ambulatory Visit: Payer: BC Managed Care – PPO

## 2021-10-28 ENCOUNTER — Encounter (HOSPITAL_BASED_OUTPATIENT_CLINIC_OR_DEPARTMENT_OTHER): Payer: Self-pay | Admitting: Emergency Medicine

## 2021-10-28 ENCOUNTER — Other Ambulatory Visit: Payer: Self-pay

## 2021-10-28 DIAGNOSIS — R509 Fever, unspecified: Secondary | ICD-10-CM | POA: Diagnosis present

## 2021-10-28 DIAGNOSIS — U071 COVID-19: Secondary | ICD-10-CM | POA: Insufficient documentation

## 2021-10-28 DIAGNOSIS — R000 Tachycardia, unspecified: Secondary | ICD-10-CM | POA: Insufficient documentation

## 2021-10-28 NOTE — ED Triage Notes (Signed)
Pt inhaled chemical 1-ethyle-3 dimethylaminopropyl on Thursday. C/o fever and nasal congestion.

## 2021-10-29 ENCOUNTER — Emergency Department (HOSPITAL_BASED_OUTPATIENT_CLINIC_OR_DEPARTMENT_OTHER): Payer: No Typology Code available for payment source

## 2021-10-29 ENCOUNTER — Emergency Department (HOSPITAL_BASED_OUTPATIENT_CLINIC_OR_DEPARTMENT_OTHER)
Admission: EM | Admit: 2021-10-29 | Discharge: 2021-10-29 | Disposition: A | Discharge status: Home / Self Care | Payer: No Typology Code available for payment source | Attending: Emergency Medicine | Admitting: Emergency Medicine

## 2021-10-29 DIAGNOSIS — R6883 Chills (without fever): Secondary | ICD-10-CM

## 2021-10-29 DIAGNOSIS — R051 Acute cough: Secondary | ICD-10-CM

## 2021-10-29 DIAGNOSIS — R0981 Nasal congestion: Secondary | ICD-10-CM

## 2021-10-29 DIAGNOSIS — R110 Nausea: Secondary | ICD-10-CM

## 2021-10-29 DIAGNOSIS — U071 COVID-19: Secondary | ICD-10-CM | POA: Diagnosis not present

## 2021-10-29 HISTORY — DX: Malignant (primary) neoplasm, unspecified: C80.1

## 2021-10-29 HISTORY — DX: Disorder of thyroid, unspecified: E07.9

## 2021-10-29 LAB — RESP PANEL BY RT-PCR (FLU A&B, COVID) ARPGX2
Influenza A by PCR: NEGATIVE
Influenza B by PCR: NEGATIVE
SARS Coronavirus 2 by RT PCR: POSITIVE — AB

## 2021-10-29 MED ORDER — ONDANSETRON HCL 4 MG PO TABS: 4.0000 mg | ORAL_TABLET | Freq: Three times a day (TID) | ORAL | 0 refills | Status: DC | PRN

## 2021-10-29 NOTE — ED Provider Notes (Signed)
Toni Alvarez   CSN: [id number] Arrival date & time: 10/28/21  2324     History  Chief Complaint  Patient presents with   Chemical Exposure    Toni Alvarez is a 36 y.o. female.  The history is provided by the patient and medical records. No language interpreter was used.  URI Presenting symptoms: congestion, cough, fatigue, fever and rhinorrhea   Severity:  Moderate Onset quality:  Gradual Duration:  4 days Timing:  Constant Progression:  Unchanged Chronicity:  New Relieved by:  Nothing Worsened by:  Nothing Ineffective treatments:  None tried Associated symptoms: no headaches and no neck pain       Home Medications Prior to Admission medications   Medication Sig Start Date End Date Taking? Authorizing Provider  levothyroxine (SYNTHROID) 100 MCG tablet Take 1 tablet (100 mcg total) by mouth daily. 01/03/21   Shamleffer, Melanie Crazier, MD      Allergies    Patient has no known allergies.    Review of Systems   Review of Systems  Constitutional:  Positive for chills, fatigue and fever.  HENT:  Positive for congestion and rhinorrhea.   Eyes:  Negative for visual disturbance.  Respiratory:  Positive for cough. Negative for chest tightness and shortness of breath.   Cardiovascular:  Negative for chest pain and palpitations.  Gastrointestinal:  Positive for nausea. Negative for abdominal pain, constipation, diarrhea and vomiting.  Genitourinary:  Negative for dysuria.  Musculoskeletal:  Negative for back pain and neck pain.  Skin:  Negative for rash and wound.  Neurological:  Negative for dizziness, weakness, light-headedness and headaches.  Psychiatric/Behavioral:  Negative for agitation and confusion.   All other systems reviewed and are negative.  Physical Exam Updated Vital Signs BP 104/73   Pulse 99   Temp 98.6 F (37 C)   Resp 18   Ht '4\' 11"'$  (1.499 m)   Wt 55 kg   LMP 10/25/2021   SpO2 99%   BMI  24.49 kg/m  Physical Exam Vitals and nursing Alvarez reviewed.  Constitutional:      General: She is not in acute distress.    Appearance: She is well-developed. She is not ill-appearing, toxic-appearing or diaphoretic.  HENT:     Head: Normocephalic and atraumatic.     Nose: Congestion and rhinorrhea present.     Mouth/Throat:     Mouth: Mucous membranes are moist.  Eyes:     Extraocular Movements: Extraocular movements intact.     Conjunctiva/sclera: Conjunctivae normal.     Pupils: Pupils are equal, round, and reactive to light.  Cardiovascular:     Rate and Rhythm: Regular rhythm. Tachycardia present.     Heart sounds: No murmur heard. Pulmonary:     Effort: Pulmonary effort is normal. No respiratory distress.     Breath sounds: Normal breath sounds. No wheezing, rhonchi or rales.  Chest:     Chest wall: No tenderness.  Abdominal:     General: Abdomen is flat.     Palpations: Abdomen is soft.     Tenderness: There is no abdominal tenderness. There is no right CVA tenderness, left CVA tenderness, guarding or rebound.  Musculoskeletal:        General: No swelling or tenderness.     Cervical back: Neck supple. No tenderness.     Right lower leg: No edema.     Left lower leg: No edema.  Skin:    General: Skin is warm and dry.  Capillary Refill: Capillary refill takes less than 2 seconds.     Findings: No erythema or rash.  Neurological:     General: No focal deficit present.     Mental Status: She is alert.  Psychiatric:        Mood and Affect: Mood normal.    ED Results / Procedures / Treatments   Labs (all labs ordered are listed, but only abnormal results are displayed) Labs Reviewed  RESP PANEL BY RT-PCR (FLU A&B, COVID) ARPGX2 - Abnormal; Notable for the following components:      Result Value   SARS Coronavirus 2 by RT PCR POSITIVE (*)    All other components within normal limits    EKG EKG Interpretation  Date/Time:  Saturday Oct 28 2021 23:46:19  EDT Ventricular Rate:  115 PR Interval:  128 QRS Duration: 66 QT Interval:  322 QTC Calculation: 445 R Axis:   75 Text Interpretation: Sinus tachycardia Otherwise normal ECG No previous ECGs available Confirmed by Ripley Fraise [phone]) on 10/28/2021 11:56:15 PM  Radiology DG Chest Port 1 View  Result Date: 10/29/2021 CLINICAL DATA:  36 year old female with history of fever and nasal congestion. Chemical inhalational exposure. EXAM: PORTABLE CHEST 1 VIEW COMPARISON:  No priors. FINDINGS: Lung volumes are normal. No consolidative airspace disease. No pleural effusions. No pneumothorax. No pulmonary nodule or mass noted. Pulmonary vasculature and the cardiomediastinal silhouette are within normal limits. IMPRESSION: No radiographic evidence of acute cardiopulmonary disease. Electronically Signed   By: Vinnie Langton M.D.   On: 10/29/2021 06:48    Procedures Procedures    Medications Ordered in ED Medications - No data to display  ED Course/ Medical Decision Making/ A&P                           Medical Decision Making Problems Addressed: COVID-19: acute illness or injury  Amount and/or Complexity of Data Reviewed Labs: ordered. Radiology: ordered and independent interpretation performed.  Risk Prescription drug management.    Toni Alvarez is a 36 y.o. female with a past medical history significant for thyroid disease who presents with chills, congestion, rhinorrhea, and cough.  According to patient, on Thursday, 4 days ago, patient making some chemicals at work when she said she was exposed and smelled one of the chemicals.  It was 1-ethyle-3 dimethylaminopropyl.  She reports she tasted something spicy in her mouth and felt irritation in her nose and throat.  She then says she was having rhinorrhea, congestion, and cough for the last few days.  She also reports that she went to a concert last weekend.  When patient's symptoms persisted she presents emergency  department to rule out toxic exposure or other concerning abnormality.  She currently denies any chest pain, shortness of breath, constipation, or diarrhea.  She does report some nausea.  Denies urinary changes.  No vomiting.  No skin changes otherwise.  On exam, lungs clear and chest nontender.  Abdomen nontender.  She is slightly tachycardic but did have some rhinorrhea and congestion.  Patient otherwise well-appearing.  No leg tenderness or leg swelling.  Good pulses in extremities.  Oropharyngeal exam showed some rhinorrhea and congestion but otherwise no significant erythema seen.  The overnight team spoke with poison control who felt that the symptoms should have resolved in the last 4 days after some minor respiratory irritation.  Patient had a chest x-ray that did not show pneumonia or other evidence of pulmonary injury or compromise.  I personally viewed and interpreted the images and did not see any concerning findings.  Patient maintain oxygen saturations on room air.  The test that did return positive was a coronavirus test.  I viewed and interpreted the lab result personally.   Suspect symptoms are related to COVID primarily and had some initial irritation related to the possible chemical exposure.  As patient symptoms ongoing for 4 days and patient has otherwise well appearance with lack of significant symptoms, we agreed together to hold on anticoagulant neuritis medications however we will give her prescription for nausea medicine to help with the intermittent nausea to maintain hydration.  We will give her a work Alvarez for this week as she denies coronavirus and she will speak to her PCP/health at work or student health as to when to return.  She understands return precautions and follow-up instructions however we do feel that given her well-appearing she is safe for discharge home.  Patient agrees.  Patient be discharged with diagnosis of COVID-19 after otherwise reassuring examination  for toxic exposure.        Final Clinical Impression(s) / ED Diagnoses Final diagnoses:  COVID-19  Chills  Nasal congestion  Acute cough  Nausea    Rx / DC Orders ED Discharge Orders          Ordered    ondansetron (ZOFRAN) 4 MG tablet  Every 8 hours PRN        10/29/21 0753            Clinical Impression: 1. COVID-19   2. Chills   3. Nasal congestion   4. Acute cough   5. Nausea     Disposition: Discharge  Condition: Good  I have discussed the results, Dx and Tx plan with the pt(& family if present). He/she/they expressed understanding and agree(s) with the plan. Discharge instructions discussed at great length. Strict return precautions discussed and pt &/or family have verbalized understanding of the instructions. No further questions at time of discharge.    New Prescriptions   ONDANSETRON (ZOFRAN) 4 MG TABLET    Take 1 tablet (4 mg total) by mouth every 8 (eight) hours as needed for nausea or vomiting.    Follow Up: Keith Rake, MD [address] [place] [address] [facility] [place] [address] [phone]        Kymani Shimabukuro, Gwenyth Allegra, MD 10/29/21 [phone]

## 2021-10-29 NOTE — ED Notes (Signed)
Spoke with poison control and information provided regarding chemical exposure. They state this patient would initially possibly present with respiratory irritation, but being this is a couple days out, they do not have any recommendations at this time regarding the chemical exposure.

## 2021-10-29 NOTE — Discharge Instructions (Signed)
Your history, exam, work-up today confirmed COVID-19 as the likely cause of your congestion, chills, and cough.  The chemicals you were exposed to on Thursday likely did cause some respiratory irritation as we discussed after discussion with poison control however, they expect the symptoms should have resolved by now.  As your COVID test is positive, I suspect your symptoms are due to from the coronavirus.  With the nausea, please use the nausea medicine to help maintain hydration and rest.  Please follow-up with your primary doctor.  Please isolate the appropriate amount of time as per student health or your job.  If any symptoms change or worsen acutely, please return to the nearest emergency department

## 2021-12-17 ENCOUNTER — Other Ambulatory Visit: Payer: Self-pay

## 2021-12-17 ENCOUNTER — Encounter (HOSPITAL_BASED_OUTPATIENT_CLINIC_OR_DEPARTMENT_OTHER): Payer: Self-pay | Admitting: *Deleted

## 2021-12-17 ENCOUNTER — Emergency Department (HOSPITAL_BASED_OUTPATIENT_CLINIC_OR_DEPARTMENT_OTHER): Payer: Commercial Managed Care - HMO

## 2021-12-17 ENCOUNTER — Emergency Department (HOSPITAL_BASED_OUTPATIENT_CLINIC_OR_DEPARTMENT_OTHER)
Admission: EM | Admit: 2021-12-17 | Discharge: 2021-12-17 | Disposition: A | Discharge status: Home / Self Care | Payer: Commercial Managed Care - HMO | Attending: Emergency Medicine | Admitting: Emergency Medicine

## 2021-12-17 DIAGNOSIS — R1030 Lower abdominal pain, unspecified: Secondary | ICD-10-CM | POA: Diagnosis not present

## 2021-12-17 DIAGNOSIS — Z859 Personal history of malignant neoplasm, unspecified: Secondary | ICD-10-CM | POA: Insufficient documentation

## 2021-12-17 DIAGNOSIS — R339 Retention of urine, unspecified: Secondary | ICD-10-CM | POA: Diagnosis present

## 2021-12-17 DIAGNOSIS — D259 Leiomyoma of uterus, unspecified: Secondary | ICD-10-CM

## 2021-12-17 LAB — URINALYSIS, ROUTINE W REFLEX MICROSCOPIC
Bilirubin Urine: NEGATIVE
Glucose, UA: NEGATIVE mg/dL
Hgb urine dipstick: NEGATIVE
Ketones, ur: NEGATIVE mg/dL
Leukocytes,Ua: NEGATIVE
Nitrite: NEGATIVE
Protein, ur: NEGATIVE mg/dL
Specific Gravity, Urine: 1.009 (ref 1.005–1.030)
pH: 5.5 (ref 5.0–8.0)

## 2021-12-17 LAB — CBC WITH DIFFERENTIAL/PLATELET
Abs Immature Granulocytes: 0.05 10*3/uL (ref 0.00–0.07)
Basophils Absolute: 0 10*3/uL (ref 0.0–0.1)
Basophils Relative: 0 %
Eosinophils Absolute: 0 10*3/uL (ref 0.0–0.5)
Eosinophils Relative: 0 %
HCT: 37.3 % (ref 36.0–46.0)
Hemoglobin: 12.4 g/dL (ref 12.0–15.0)
Immature Granulocytes: 0 %
Lymphocytes Relative: 10 %
Lymphs Abs: 1.7 10*3/uL (ref 0.7–4.0)
MCH: 29 pg (ref 26.0–34.0)
MCHC: 33.2 g/dL (ref 30.0–36.0)
MCV: 87.1 fL (ref 80.0–100.0)
Monocytes Absolute: 0.7 10*3/uL (ref 0.1–1.0)
Monocytes Relative: 4 %
Neutro Abs: 14.4 10*3/uL — ABNORMAL HIGH (ref 1.7–7.7)
Neutrophils Relative %: 86 %
Platelets: 317 10*3/uL (ref 150–400)
RBC: 4.28 MIL/uL (ref 3.87–5.11)
RDW: 13.5 % (ref 11.5–15.5)
WBC: 16.9 10*3/uL — ABNORMAL HIGH (ref 4.0–10.5)
nRBC: 0 % (ref 0.0–0.2)

## 2021-12-17 LAB — COMPREHENSIVE METABOLIC PANEL
ALT: 65 U/L — ABNORMAL HIGH (ref 0–44)
AST: 19 U/L (ref 15–41)
Albumin: 4.8 g/dL (ref 3.5–5.0)
Alkaline Phosphatase: 56 U/L (ref 38–126)
Anion gap: 10 (ref 5–15)
BUN: 9 mg/dL (ref 6–20)
CO2: 23 mmol/L (ref 22–32)
Calcium: 9.3 mg/dL (ref 8.9–10.3)
Chloride: 106 mmol/L (ref 98–111)
Creatinine, Ser: 0.57 mg/dL (ref 0.44–1.00)
GFR, Estimated: >60 mL/min (ref >60)
Glucose, Bld: 97 mg/dL (ref 70–99)
Potassium: 3.9 mmol/L (ref 3.5–5.1)
Sodium: 139 mmol/L (ref 135–145)
Total Bilirubin: 0.6 mg/dL (ref 0.3–1.2)
Total Protein: 7.8 g/dL (ref 6.5–8.1)

## 2021-12-17 LAB — PREGNANCY, URINE: Preg Test, Ur: NEGATIVE

## 2021-12-17 MED ORDER — NAPROXEN SODIUM 220 MG PO TABS: 220.0000 mg | ORAL_TABLET | Freq: Two times a day (BID) | ORAL | 0 refills | Status: DC | PRN

## 2021-12-17 MED ORDER — IOHEXOL 300 MG/ML  SOLN
100.0000 mL | Freq: Once | INTRAMUSCULAR | Status: AC | PRN
  Administered 2021-12-17: 80 mL via INTRAVENOUS

## 2021-12-17 NOTE — ED Notes (Signed)
Foley care reviewed ,how to clean every day and night and after every bowel movement. Pt able to repeat back to RN. Demonstrated how to empty leg bag as well. Follow up care reviewed .

## 2021-12-17 NOTE — ED Triage Notes (Addendum)
Pt c/o of general abd pain that started approx 4 hours ago. States she has been unable to urinate in the past 4 hours. C/o feeling nauseated. 1 beer and 1 martini at 2000.

## 2021-12-17 NOTE — ED Provider Notes (Signed)
Emergency Department Provider Note   I have reviewed the triage vital signs and the nursing notes.   HISTORY  Chief Complaint Urinary Retention   HPI Toni Alvarez is a 36 y.o. female with PMH of thyroid cancer on synthroid presents emergency department with inability to urinate and lower abdominal pain.  Symptoms have progressively worsened over the past 4 hours. Patient had two alcoholic beverages earlier but no illicit drugs. No Rx med changes or dose changes. She describes increased suprapubic pain and fullness. Notes one prior episode of urinary retention that resolved spontaneously at home and did not require medical evaluation. When she was urinating, she denies any dysuria/hesitancy/urgency.  Past Medical History:  Diagnosis Date   Cancer [facility])    Thyroid disease     Review of Systems  Constitutional: No fever/chills Cardiovascular: Denies chest pain. Respiratory: Denies shortness of breath. Gastrointestinal: Positive lower abdominal pain.  No nausea, no vomiting.  No diarrhea.  No constipation. Genitourinary: Negative for dysuria. Positive urinary retention.  Musculoskeletal: Negative for back pain. Skin: Negative for rash. Neurological: Negative for headaches.   ____________________________________________   PHYSICAL EXAM:  VITAL SIGNS: ED Triage Vitals [12/17/21 0521]  Enc Vitals Group     BP 133/89     Pulse Rate (!) 133     Resp 20     Temp 97.9 F (36.6 C)     Temp Source Oral     SpO2 100 %     Weight 120 lb (54.4 kg)     Height 4' 11.06" (1.5 m)    Constitutional: Alert and oriented. Well appearing and in no acute distress. Eyes: Conjunctivae are normal. Head: Atraumatic. Nose: No congestion/rhinnorhea. Mouth/Throat: Mucous membranes are moist.   Neck: No stridor.   Cardiovascular: Normal rate, regular rhythm. Good peripheral circulation. Grossly normal heart sounds.   Respiratory: Normal respiratory effort.  No retractions.  Lungs CTAB. Gastrointestinal: Soft and nontender. No distention.  Musculoskeletal: No lower extremity tenderness nor edema. No gross deformities of extremities. Neurologic:  Normal speech and language. No gross focal neurologic deficits are appreciated.  Skin:  Skin is warm, dry and intact. No rash noted.   ____________________________________________   LABS (all labs ordered are listed, but only abnormal results are displayed)  Labs Reviewed  URINE CULTURE  COMPREHENSIVE METABOLIC PANEL  CBC WITH DIFFERENTIAL/PLATELET  PREGNANCY, URINE  URINALYSIS, ROUTINE W REFLEX MICROSCOPIC   ____________________________________________  RADIOLOGY  No results found.  ____________________________________________   PROCEDURES  Procedure(s) performed:   Procedures   ____________________________________________   INITIAL IMPRESSION / ASSESSMENT AND PLAN / ED COURSE  Pertinent labs & imaging results that were available during my care of the patient were reviewed by me and considered in my medical decision making (see chart for details).   This patient is Presenting for Evaluation of abdominal pain, which does require a range of treatment options, and is a complaint that involves a high risk of morbidity and mortality.  The Differential Diagnoses includes but is not exclusive to ectopic pregnancy, ovarian cyst, ovarian torsion, acute appendicitis, urinary tract infection, endometriosis, bowel obstruction, hernia, colitis, renal colic, gastroenteritis, volvulus etc.   Critical Interventions-    Medications - No data to display  Reassessment after intervention:      I decided to review pertinent External Data, and in summary last ED visit was 10/29/21 for URI symptoms.   Clinical Laboratory Tests Ordered, included   Radiologic Tests Ordered, included ***. I independently interpreted the images and agree with radiology  interpretation.   Cardiac Monitor Tracing which shows  ***   Social Determinants of Health Risk occasional EtOH. No illicit drugs.  Consult complete with  Medical Decision Making: Summary:  Patient presents to the emergency department with suprapubic pain, fullness, urinary retention.  Bladder scan shows greater than 800 mL of urine in the bladder.  Discussed this with the patient and advised placement of Foley catheter for treatment and will keep this in place.  Plan for CT imaging to evaluate for structural reason for her retention.  Only medication she is taking now is levothyroxine and denies other illicit drugs.  Reevaluation with update and discussion with   ***Considered admission***  Disposition:   ____________________________________________  FINAL CLINICAL IMPRESSION(S) / ED DIAGNOSES  Final diagnoses:  None     NEW OUTPATIENT MEDICATIONS STARTED DURING THIS VISIT:  New Prescriptions   No medications on file    Note:  This document was prepared using Dragon voice recognition software and may include unintentional dictation errors.  Nanda Quinton, MD, [facility] Emergency Medicine

## 2021-12-17 NOTE — Discharge Instructions (Addendum)
You were seen in the emergency room today with inability to urinate.  We have had to place a temporary Foley catheter which should stay in place until you are able to have it removed by the urologist.  Please call their office on Monday to schedule a follow-up appointment in the next 1 to 2 weeks.  If you develop fever or worsening abdominal pain, the Foley stops draining, or you develop other sudden/new symptoms you should return to the emergency department for reevaluation.  Please follow-up with OB/GYN regarding fibroids

## 2021-12-17 NOTE — ED Notes (Addendum)
Bladder scanner shows greater than 750 in her bladder. Pt's abdomen is firm and painful with movement. Dr. Laverta Baltimore aware.

## 2021-12-17 NOTE — ED Provider Notes (Signed)
  Provider Note MRN:  [id number]  Arrival date & time: 12/17/21    ED Course and Medical Decision Making  Assumed care from [facility] at shift change.  See not from prior team for complete details, in brief:   36 yo female w/ history thyroidectomy on levothyroxine 390 ml urinary retention, hx of 1x in past Foley in place Follow up CT, f/u w/ uro  Plan per prior physician follow up imaging  CT w/ large fibroid likely source of obstruction; continue foley, have pt f/u with GYN. Referral placed. Take aleve PRN  She has leukocytosis, unclear source.       The patient improved significantly and was discharged in stable condition. Detailed discussions were had with the patient regarding current findings, and need for close f/u with PCP or on call doctor. The patient has been instructed to return immediately if the symptoms worsen in any way for re-evaluation. Patient verbalized understanding and is in agreement with current care plan. All questions answered prior to discharge.   Procedures  Final Clinical Impressions(s) / ED Diagnoses     ICD-10-CM   1. Urinary retention  R33.9     2. Uterine leiomyoma, unspecified location  D25.9       ED Discharge Orders          Ordered    Ambulatory referral to Obstetrics / Gynecology        12/17/21 0736    naproxen sodium (ALEVE) 220 MG tablet  2 times daily PRN        12/17/21 0736              Jeanell Sparrow, DO 12/17/21 [phone]

## 2021-12-17 NOTE — ED Notes (Signed)
#  14 Foley cath inserted using sterile technique with immediate clear yellow urine noted. Tolerated well. Pt is feeling relief after foley is placed.

## 2021-12-18 ENCOUNTER — Other Ambulatory Visit: Payer: Self-pay

## 2021-12-18 ENCOUNTER — Emergency Department (HOSPITAL_BASED_OUTPATIENT_CLINIC_OR_DEPARTMENT_OTHER)
Admission: EM | Admit: 2021-12-18 | Discharge: 2021-12-18 | Disposition: A | Discharge status: Home / Self Care | Payer: Commercial Managed Care - HMO | Attending: Emergency Medicine | Admitting: Emergency Medicine

## 2021-12-18 ENCOUNTER — Encounter (HOSPITAL_BASED_OUTPATIENT_CLINIC_OR_DEPARTMENT_OTHER): Payer: Self-pay

## 2021-12-18 DIAGNOSIS — T83098A Other mechanical complication of other indwelling urethral catheter, initial encounter: Secondary | ICD-10-CM | POA: Diagnosis present

## 2021-12-18 DIAGNOSIS — R399 Unspecified symptoms and signs involving the genitourinary system: Secondary | ICD-10-CM

## 2021-12-18 DIAGNOSIS — Y732 Prosthetic and other implants, materials and accessory gastroenterology and urology devices associated with adverse incidents: Secondary | ICD-10-CM | POA: Diagnosis not present

## 2021-12-18 LAB — URINE CULTURE: Culture: NO GROWTH

## 2021-12-18 NOTE — ED Triage Notes (Signed)
Patient here POV from Home.  Endorses being in ED yesterday PM for ABD Discomfort and Urinary Retention. Foley Catheter was placed and Patient was Discharged Accordingly.   States Patient is still having Discomfort to Mid/Lower ABD and also states Catheter is leaking. Leaking from Both Insertion Site and Distally (Where Catheter attaches to Leg Bag).  NAD noted during Triage. A&Ox4. GCS 15. Ambulatory.

## 2021-12-18 NOTE — Discharge Instructions (Signed)
Follow-up with your OB/GYN physician within the week.  Return if you have fevers vomiting or cannot urinate.

## 2021-12-18 NOTE — ED Provider Notes (Signed)
[facility] EMERGENCY DEPT Provider Note   CSN: [id number] Arrival date & time: 12/18/21  2120     History  Chief Complaint  Patient presents with   Catheter Problem    Toni Alvarez is a 36 y.o. female.  Patient presents chief complaint of concern about her catheter.  She states that she noticed some urine leaking around the insertion site as well as being concerned that it was not draining properly.  Otherwise denies any new fevers or cough or vomiting or diarrhea.  Denies any new abdominal pains.       Home Medications Prior to Admission medications   Medication Sig Start Date End Date Taking? Authorizing Provider  levothyroxine (SYNTHROID) 100 MCG tablet Take 1 tablet (100 mcg total) by mouth daily. 01/03/21   Shamleffer, Melanie Crazier, MD  naproxen sodium (ALEVE) 220 MG tablet Take 1 tablet (220 mg total) by mouth 2 (two) times daily as needed for up to 7 days. 12/17/21 12/24/21  Jeanell Sparrow, DO      Allergies    Patient has no known allergies.    Review of Systems   Review of Systems  Constitutional:  Negative for fever.  HENT:  Negative for ear pain.   Eyes:  Negative for pain.  Respiratory:  Negative for cough.   Cardiovascular:  Negative for chest pain.  Gastrointestinal:  Negative for abdominal pain.  Genitourinary:  Negative for flank pain.  Musculoskeletal:  Negative for back pain.  Skin:  Negative for rash.  Neurological:  Negative for headaches.    Physical Exam Updated Vital Signs BP 118/79   Pulse 92   Temp 98.1 F (36.7 C) (Temporal)   Resp 16   Ht 4' 11.06" (1.5 m)   Wt 54.4 kg   LMP 11/22/2021 (Approximate)   SpO2 100%   BMI 24.17 kg/m  Physical Exam Constitutional:      General: She is not in acute distress.    Appearance: Normal appearance.  HENT:     Head: Normocephalic.     Nose: Nose normal.  Eyes:     Extraocular Movements: Extraocular movements intact.  Cardiovascular:     Rate and Rhythm: Normal  rate.  Pulmonary:     Effort: Pulmonary effort is normal.  Abdominal:     Tenderness: There is no abdominal tenderness. There is no guarding or rebound.  Musculoskeletal:        General: Normal range of motion.     Cervical back: Normal range of motion.  Neurological:     General: No focal deficit present.     Mental Status: She is alert. Mental status is at baseline.     ED Results / Procedures / Treatments   Labs (all labs ordered are listed, but only abnormal results are displayed) Labs Reviewed - No data to display  EKG None  Radiology CT ABDOMEN PELVIS W CONTRAST  Result Date: 12/17/2021 CLINICAL DATA:  Abdominal pain and nausea.  Dysuria EXAM: CT ABDOMEN AND PELVIS WITH CONTRAST TECHNIQUE: Multidetector CT imaging of the abdomen and pelvis was performed using the standard protocol following bolus administration of intravenous contrast. RADIATION DOSE REDUCTION: This exam was performed according to the departmental dose-optimization program which includes automated exposure control, adjustment of the mA and/or kV according to patient size and/or use of iterative reconstruction technique. CONTRAST:  11m OMNIPAQUE IOHEXOL 300 MG/ML  SOLN COMPARISON:  None Available. FINDINGS: Lower chest: No acute abnormality. Hepatobiliary: No focal liver abnormality is seen. No gallstones, gallbladder  wall thickening, or biliary dilatation. Pancreas: Unremarkable. No pancreatic ductal dilatation or surrounding inflammatory changes. Spleen: Normal in size without focal abnormality. Adrenals/Urinary Tract: Normal adrenal glands. No nephrolithiasis or suspicious mass identified. There is right pelvocaliectasis and right hydroureter identified. No obstructing stone identified. No left-sided hydronephrosis or hydroureter. Urinary bladder is decompressed around a Foley catheter. Stomach/Bowel: Stomach appears within normal limits. The appendix is visualized and appears normal. The no bowel wall thickening,  inflammation, or distension. Vascular/Lymphatic: No significant vascular findings are present. No enlarged abdominal or pelvic lymph nodes. Reproductive: There is a large mass within the left posterior uterine corpus which measures 8.3 x 8.8 by 7.6 cm. This has mass effect upon the lower uterine canal and there is a small volume of fluid within the mid and distal uterine cavity. The uterus may also be exerting mass effect upon the distal ureter accounting for the right hydroureter and pelvocaliectasis. The right ovary measures 4.4 x 2.6 by 3.8 cm (volume = 23 cm^3). There is a corpus luteum identified within the right ovary. The left ovary measures 4.2 x 3.0 by 2.7 cm (volume = 18 cm^3). No discrete mass identified. Other: No significant free fluid or fluid collections. No signs of pneumoperitoneum. Musculoskeletal: No acute or significant osseous findings. IMPRESSION: 1. There is a large mass within the left posterior uterine corpus which has mass effect upon the lower uterine canal with a small volume of fluid within the mid and distal uterine cavity. Findings are favored to represent a large fibroid. 2. Bilateral ovarian enlargement. This is a nonspecific finding. Recommend further evaluation with pelvic sonogram. 3. Right-sided pelvocaliectasis with hydroureter. This may reflect mass effect upon the distal right ureter due to the enlarged uterus. This could also be seen with recently passed stone or ascending urinary tract infection. Electronically Signed   By: Kerby Moors M.D.   On: 12/17/2021 07:19    Procedures Procedures    Medications Ordered in ED Medications - No data to display  ED Course/ Medical Decision Making/ A&P                           Medical Decision Making  Review of record shows prior visit yesterday for urine retention.  Cardiac monitor shows sinus rhythm.  No additional diagnosis studies ordered.  At bedside the leg bag has about 300 cc of urine.  Patient states that  she last emptied it at 7 PM.  It does appear to be draining well.  Bag was removed and the catheter flushed and appears to be flowing freely.  Patient is in no acute distress.  Discharged home in stable condition catheter appears functional, advised continued follow-up with gynecology, has an appointment on Monday which she was advised to keep.  Advised return if she has fevers worsening symptoms or any additional concerns.        Final Clinical Impression(s) / ED Diagnoses Final diagnoses:  Complaint about urinary catheter    Rx / DC Orders ED Discharge Orders     None         Luna Fuse, MD 12/18/21 2239

## 2021-12-22 ENCOUNTER — Observation Stay (HOSPITAL_COMMUNITY): Payer: Commercial Managed Care - HMO | Admitting: Anesthesiology

## 2021-12-22 ENCOUNTER — Other Ambulatory Visit: Payer: Self-pay

## 2021-12-22 ENCOUNTER — Encounter (HOSPITAL_COMMUNITY): Payer: Self-pay | Admitting: Obstetrics & Gynecology

## 2021-12-22 ENCOUNTER — Other Ambulatory Visit: Payer: Self-pay | Admitting: Obstetrics & Gynecology

## 2021-12-22 ENCOUNTER — Encounter (HOSPITAL_COMMUNITY)
Admission: RE | Disposition: A | Discharge status: Home / Self Care | Payer: Self-pay | Attending: Obstetrics & Gynecology

## 2021-12-22 ENCOUNTER — Telehealth (HOSPITAL_BASED_OUTPATIENT_CLINIC_OR_DEPARTMENT_OTHER): Payer: Self-pay | Admitting: *Deleted

## 2021-12-22 ENCOUNTER — Inpatient Hospital Stay (HOSPITAL_COMMUNITY)
Admission: RE | Admit: 2021-12-22 | Discharge: 2021-12-24 | DRG: 742 | Disposition: A | Discharge status: Home / Self Care | Payer: Commercial Managed Care - HMO | Source: Other Acute Inpatient Hospital | Attending: Obstetrics & Gynecology | Admitting: Obstetrics & Gynecology

## 2021-12-22 DIAGNOSIS — M79604 Pain in right leg: Secondary | ICD-10-CM | POA: Diagnosis not present

## 2021-12-22 DIAGNOSIS — N920 Excessive and frequent menstruation with regular cycle: Secondary | ICD-10-CM | POA: Diagnosis present

## 2021-12-22 DIAGNOSIS — D251 Intramural leiomyoma of uterus: Secondary | ICD-10-CM

## 2021-12-22 DIAGNOSIS — R102 Pelvic and perineal pain: Secondary | ICD-10-CM | POA: Diagnosis present

## 2021-12-22 DIAGNOSIS — Z9889 Other specified postprocedural states: Secondary | ICD-10-CM

## 2021-12-22 DIAGNOSIS — N390 Urinary tract infection, site not specified: Secondary | ICD-10-CM | POA: Diagnosis present

## 2021-12-22 DIAGNOSIS — Z85850 Personal history of malignant neoplasm of thyroid: Secondary | ICD-10-CM | POA: Diagnosis not present

## 2021-12-22 HISTORY — PX: MYOMECTOMY: SHX85

## 2021-12-22 LAB — CBC
HCT: 41.8 % (ref 36.0–46.0)
HCT: 36.6 % (ref 36.0–46.0)
Hemoglobin: 13.4 g/dL (ref 12.0–15.0)
Hemoglobin: 12.4 g/dL (ref 12.0–15.0)
MCH: 28.7 pg (ref 26.0–34.0)
MCH: 29.7 pg (ref 26.0–34.0)
MCHC: 32.1 g/dL (ref 30.0–36.0)
MCHC: 33.9 g/dL (ref 30.0–36.0)
MCV: 89.5 fL (ref 80.0–100.0)
MCV: 87.6 fL (ref 80.0–100.0)
Platelets: 278 10*3/uL (ref 150–400)
Platelets: 216 10*3/uL (ref 150–400)
RBC: 4.67 MIL/uL (ref 3.87–5.11)
RBC: 4.18 MIL/uL (ref 3.87–5.11)
RDW: 13.5 % (ref 11.5–15.5)
RDW: 13.5 % (ref 11.5–15.5)
WBC: 8.5 10*3/uL (ref 4.0–10.5)
WBC: 17.5 10*3/uL — ABNORMAL HIGH (ref 4.0–10.5)
nRBC: 0 % (ref 0.0–0.2)
nRBC: 0 % (ref 0.0–0.2)

## 2021-12-22 LAB — ABO/RH: ABO/RH(D): O POS

## 2021-12-22 LAB — TYPE AND SCREEN
ABO/RH(D): O POS
Antibody Screen: NEGATIVE

## 2021-12-22 LAB — POCT PREGNANCY, URINE: Preg Test, Ur: NEGATIVE

## 2021-12-22 SURGERY — MYOMECTOMY, ABDOMINAL APPROACH
Anesthesia: General

## 2021-12-22 MED ORDER — ORAL CARE MOUTH RINSE
15.0000 mL | Freq: Once | OROMUCOSAL | Status: AC
Start: 2021-12-22 — End: 2021-12-22

## 2021-12-22 MED ORDER — ZOLPIDEM TARTRATE 5 MG PO TABS: 5.0000 mg | ORAL_TABLET | Freq: Every evening | ORAL | Status: DC | PRN

## 2021-12-22 MED ORDER — ACETAMINOPHEN 10 MG/ML IV SOLN
INTRAVENOUS | Status: AC
Start: 2021-12-22 — End: ?
  Filled 2021-12-22: qty 100

## 2021-12-22 MED ORDER — KETOROLAC TROMETHAMINE 30 MG/ML IJ SOLN: INTRAMUSCULAR | Status: DC | PRN

## 2021-12-22 MED ORDER — ROCURONIUM BROMIDE 10 MG/ML (PF) SYRINGE
PREFILLED_SYRINGE | INTRAVENOUS | Status: DC | PRN
  Administered 2021-12-22: 50 mg via INTRAVENOUS
  Administered 2021-12-22: 20 mg via INTRAVENOUS
  Administered 2021-12-22: 10 mg via INTRAVENOUS

## 2021-12-22 MED ORDER — ONDANSETRON HCL 4 MG/2ML IJ SOLN
INTRAMUSCULAR | Status: AC
  Filled 2021-12-22: qty 2

## 2021-12-22 MED ORDER — DEXAMETHASONE SODIUM PHOSPHATE 10 MG/ML IJ SOLN
INTRAMUSCULAR | Status: AC
  Filled 2021-12-22: qty 1

## 2021-12-22 MED ORDER — SODIUM CHLORIDE (PF) 0.9 % IJ SOLN
INTRAMUSCULAR | Status: AC
Start: 2021-12-22 — End: ?
  Filled 2021-12-22: qty 100

## 2021-12-22 MED ORDER — PHENYLEPHRINE HCL-NACL 20-0.9 MG/250ML-% IV SOLN
INTRAVENOUS | Status: AC
Start: 2021-12-22 — End: ?
  Filled 2021-12-22: qty 250

## 2021-12-22 MED ORDER — HYDROMORPHONE HCL 1 MG/ML IJ SOLN
INTRAMUSCULAR | Status: AC
  Filled 2021-12-22: qty 1

## 2021-12-22 MED ORDER — PROPOFOL 10 MG/ML IV BOLUS
INTRAVENOUS | Status: AC
  Filled 2021-12-22: qty 20

## 2021-12-22 MED ORDER — ONDANSETRON HCL 4 MG PO TABS: 4.0000 mg | ORAL_TABLET | Freq: Four times a day (QID) | ORAL | Status: DC | PRN

## 2021-12-22 MED ORDER — LIDOCAINE 2% (20 MG/ML) 5 ML SYRINGE
INTRAMUSCULAR | Status: AC
  Filled 2021-12-22: qty 5

## 2021-12-22 MED ORDER — DEXMEDETOMIDINE (PRECEDEX) IN NS 20 MCG/5ML (4 MCG/ML) IV SYRINGE
PREFILLED_SYRINGE | INTRAVENOUS | Status: DC | PRN
  Administered 2021-12-22: 8 ug via INTRAVENOUS

## 2021-12-22 MED ORDER — CEFAZOLIN SODIUM-DEXTROSE 2-4 GM/100ML-% IV SOLN
2.0000 g | INTRAVENOUS | Status: AC
  Administered 2021-12-22: 2 g via INTRAVENOUS
  Filled 2021-12-22: qty 100

## 2021-12-22 MED ORDER — LEVOTHYROXINE SODIUM 25 MCG PO TABS
100.0000 ug | ORAL_TABLET | Freq: Every day | ORAL | Status: DC
  Administered 2021-12-23 – 2021-12-24 (×2): 100 ug via ORAL
  Filled 2021-12-22 (×2): qty 4

## 2021-12-22 MED ORDER — FENTANYL CITRATE (PF) 250 MCG/5ML IJ SOLN
INTRAMUSCULAR | Status: AC
  Filled 2021-12-22: qty 5

## 2021-12-22 MED ORDER — GLYCOPYRROLATE PF 0.2 MG/ML IJ SOSY
PREFILLED_SYRINGE | INTRAMUSCULAR | Status: AC
  Filled 2021-12-22: qty 1

## 2021-12-22 MED ORDER — OXYCODONE HCL 5 MG PO TABS
5.0000 mg | ORAL_TABLET | ORAL | Status: DC | PRN
  Administered 2021-12-23: 5 mg via ORAL
  Filled 2021-12-22: qty 2
  Filled 2021-12-22: qty 1

## 2021-12-22 MED ORDER — METHYLENE BLUE 1 % INJ SOLN
INTRAVENOUS | Status: AC
  Filled 2021-12-22: qty 10

## 2021-12-22 MED ORDER — LIDOCAINE 2% (20 MG/ML) 5 ML SYRINGE
INTRAMUSCULAR | Status: DC | PRN
  Administered 2021-12-22: 50 mg via INTRAVENOUS

## 2021-12-22 MED ORDER — MIDAZOLAM HCL 2 MG/2ML IJ SOLN
INTRAMUSCULAR | Status: AC
  Filled 2021-12-22: qty 2

## 2021-12-22 MED ORDER — POVIDONE-IODINE 10 % EX SWAB
2.0000 | Freq: Once | CUTANEOUS | Status: AC
  Administered 2021-12-22: 2 via TOPICAL

## 2021-12-22 MED ORDER — ACETAMINOPHEN 500 MG PO TABS
1000.0000 mg | ORAL_TABLET | Freq: Four times a day (QID) | ORAL | Status: DC
  Administered 2021-12-22 – 2021-12-24 (×6): 1000 mg via ORAL
  Filled 2021-12-22 (×6): qty 2

## 2021-12-22 MED ORDER — CHLORHEXIDINE GLUCONATE 0.12 % MT SOLN
OROMUCOSAL | Status: AC
  Administered 2021-12-22: 15 mL via OROMUCOSAL
  Filled 2021-12-22: qty 15

## 2021-12-22 MED ORDER — DOCUSATE SODIUM 100 MG PO CAPS
100.0000 mg | ORAL_CAPSULE | Freq: Two times a day (BID) | ORAL | Status: DC
Start: 2021-12-22 — End: 2021-12-24
  Administered 2021-12-22 – 2021-12-23 (×3): 100 mg via ORAL
  Filled 2021-12-22 (×3): qty 1

## 2021-12-22 MED ORDER — HYDROMORPHONE HCL 1 MG/ML IJ SOLN
0.2500 mg | INTRAMUSCULAR | Status: DC | PRN
  Administered 2021-12-22: 0.5 mg via INTRAVENOUS
  Administered 2021-12-22 (×2): 0.25 mg via INTRAVENOUS

## 2021-12-22 MED ORDER — MENTHOL 3 MG MT LOZG: 1.0000 | LOZENGE | OROMUCOSAL | Status: DC | PRN

## 2021-12-22 MED ORDER — CHLORHEXIDINE GLUCONATE 0.12 % MT SOLN
15.0000 mL | Freq: Once | OROMUCOSAL | Status: AC
Start: 2021-12-22 — End: 2021-12-22

## 2021-12-22 MED ORDER — KETOROLAC TROMETHAMINE 30 MG/ML IJ SOLN
INTRAMUSCULAR | Status: DC | PRN
  Administered 2021-12-22: 30 mg via INTRAVENOUS

## 2021-12-22 MED ORDER — SULFAMETHOXAZOLE-TRIMETHOPRIM 400-80 MG PO TABS: 1.0000 | ORAL_TABLET | Freq: Two times a day (BID) | ORAL | Status: DC

## 2021-12-22 MED ORDER — ROCURONIUM BROMIDE 10 MG/ML (PF) SYRINGE
PREFILLED_SYRINGE | INTRAVENOUS | Status: AC
  Filled 2021-12-22: qty 10

## 2021-12-22 MED ORDER — MIDAZOLAM HCL 2 MG/2ML IJ SOLN
INTRAMUSCULAR | Status: DC | PRN
  Administered 2021-12-22: 2 mg via INTRAVENOUS

## 2021-12-22 MED ORDER — BUPIVACAINE LIPOSOME 1.3 % IJ SUSP
INTRAMUSCULAR | Status: DC | PRN
  Administered 2021-12-22: 30 mL via INTRAMUSCULAR

## 2021-12-22 MED ORDER — ONDANSETRON HCL 4 MG/2ML IJ SOLN
INTRAMUSCULAR | Status: DC | PRN
  Administered 2021-12-22: 4 mg via INTRAVENOUS
  Administered 2021-12-22: 8 mg via INTRAVENOUS

## 2021-12-22 MED ORDER — SODIUM CHLORIDE 0.9 % IV SOLN: 12.5000 mg | Freq: Once | INTRAVENOUS | Status: DC

## 2021-12-22 MED ORDER — SODIUM CHLORIDE (PF) 0.9 % IJ SOLN
INTRAVENOUS | Status: DC | PRN
  Administered 2021-12-22: 10 mL

## 2021-12-22 MED ORDER — SUGAMMADEX SODIUM 200 MG/2ML IV SOLN
INTRAVENOUS | Status: DC | PRN
  Administered 2021-12-22: 200 mg via INTRAVENOUS

## 2021-12-22 MED ORDER — BISACODYL 10 MG RE SUPP: 10.0000 mg | Freq: Every day | RECTAL | Status: DC | PRN

## 2021-12-22 MED ORDER — SENNOSIDES-DOCUSATE SODIUM 8.6-50 MG PO TABS
1.0000 | ORAL_TABLET | Freq: Every evening | ORAL | Status: DC | PRN
Start: 2021-12-22 — End: 2021-12-24

## 2021-12-22 MED ORDER — KETOROLAC TROMETHAMINE 30 MG/ML IJ SOLN
30.0000 mg | Freq: Four times a day (QID) | INTRAMUSCULAR | Status: AC
  Administered 2021-12-22 – 2021-12-23 (×2): 30 mg via INTRAVENOUS
  Filled 2021-12-22 (×2): qty 1

## 2021-12-22 MED ORDER — VASOPRESSIN 20 UNIT/ML IV SOLN
INTRAVENOUS | Status: DC | PRN
  Administered 2021-12-22: 13 mL via SURGICAL_CAVITY

## 2021-12-22 MED ORDER — LACTATED RINGERS IV SOLN: INTRAVENOUS | Status: DC

## 2021-12-22 MED ORDER — SIMETHICONE 80 MG PO CHEW
80.0000 mg | CHEWABLE_TABLET | Freq: Four times a day (QID) | ORAL | Status: DC | PRN
  Administered 2021-12-23: 80 mg via ORAL
  Filled 2021-12-22: qty 1

## 2021-12-22 MED ORDER — LACTATED RINGERS IV SOLN: INTRAVENOUS | Status: DC | PRN

## 2021-12-22 MED ORDER — PROMETHAZINE HCL 25 MG/ML IJ SOLN
INTRAMUSCULAR | Status: AC
  Administered 2021-12-22: 12.5 mg
  Filled 2021-12-22: qty 1

## 2021-12-22 MED ORDER — MISOPROSTOL 200 MCG PO TABS
400.0000 ug | ORAL_TABLET | Freq: Once | ORAL | Status: AC
  Administered 2021-12-22: 400 ug via RECTAL
  Filled 2021-12-22: qty 2

## 2021-12-22 MED ORDER — VASOPRESSIN 20 UNIT/ML IV SOLN
INTRAVENOUS | Status: AC
  Filled 2021-12-22: qty 1

## 2021-12-22 MED ORDER — SULFAMETHOXAZOLE-TRIMETHOPRIM 800-160 MG PO TABS
1.0000 | ORAL_TABLET | Freq: Two times a day (BID) | ORAL | Status: DC
Start: 2021-12-22 — End: 2021-12-24
  Administered 2021-12-22 – 2021-12-24 (×4): 1 via ORAL
  Filled 2021-12-22 (×6): qty 1

## 2021-12-22 MED ORDER — PANTOPRAZOLE SODIUM 40 MG PO TBEC
40.0000 mg | DELAYED_RELEASE_TABLET | Freq: Every day | ORAL | Status: DC
  Administered 2021-12-22 – 2021-12-23 (×2): 40 mg via ORAL
  Filled 2021-12-22 (×2): qty 1

## 2021-12-22 MED ORDER — FLEET ENEMA 7-19 GM/118ML RE ENEM: 1.0000 | ENEMA | Freq: Once | RECTAL | Status: DC | PRN

## 2021-12-22 MED ORDER — DEXAMETHASONE SODIUM PHOSPHATE 10 MG/ML IJ SOLN
INTRAMUSCULAR | Status: AC
Start: 2021-12-22 — End: ?
  Filled 2021-12-22: qty 2

## 2021-12-22 MED ORDER — BUPIVACAINE LIPOSOME 1.3 % IJ SUSP
INTRAMUSCULAR | Status: AC
  Filled 2021-12-22: qty 20

## 2021-12-22 MED ORDER — KETOROLAC TROMETHAMINE 30 MG/ML IJ SOLN: 30.0000 mg | Freq: Four times a day (QID) | INTRAMUSCULAR | Status: DC

## 2021-12-22 MED ORDER — 0.9 % SODIUM CHLORIDE (POUR BTL) OPTIME
TOPICAL | Status: DC | PRN
  Administered 2021-12-22: 3000 mL

## 2021-12-22 MED ORDER — PROPOFOL 10 MG/ML IV BOLUS
INTRAVENOUS | Status: DC | PRN
  Administered 2021-12-22: 100 mg via INTRAVENOUS
  Administered 2021-12-22: 30 mg via INTRAVENOUS

## 2021-12-22 MED ORDER — ACETAMINOPHEN 10 MG/ML IV SOLN
INTRAVENOUS | Status: DC | PRN
  Administered 2021-12-22: 1000 mg via INTRAVENOUS

## 2021-12-22 MED ORDER — HYDROMORPHONE HCL 1 MG/ML IJ SOLN
0.2000 mg | INTRAMUSCULAR | Status: DC | PRN
  Administered 2021-12-23: 0.5 mg via INTRAVENOUS
  Filled 2021-12-22: qty 1

## 2021-12-22 MED ORDER — DEXAMETHASONE SODIUM PHOSPHATE 10 MG/ML IJ SOLN
INTRAMUSCULAR | Status: DC | PRN
  Administered 2021-12-22: 6 mg via INTRAVENOUS
  Administered 2021-12-22: 4 mg via INTRAVENOUS

## 2021-12-22 MED ORDER — PHENYLEPHRINE 80 MCG/ML (10ML) SYRINGE FOR IV PUSH (FOR BLOOD PRESSURE SUPPORT)
PREFILLED_SYRINGE | INTRAVENOUS | Status: AC
  Filled 2021-12-22: qty 10

## 2021-12-22 MED ORDER — TRANEXAMIC ACID-NACL 1000-0.7 MG/100ML-% IV SOLN
1000.0000 mg | INTRAVENOUS | Status: AC
  Administered 2021-12-22: 1000 mg via INTRAVENOUS

## 2021-12-22 MED ORDER — PANTOPRAZOLE SODIUM 40 MG IV SOLR: 40.0000 mg | Freq: Every day | INTRAVENOUS | Status: DC

## 2021-12-22 MED ORDER — FENTANYL CITRATE (PF) 250 MCG/5ML IJ SOLN
INTRAMUSCULAR | Status: DC | PRN
  Administered 2021-12-22: 100 ug via INTRAVENOUS
  Administered 2021-12-22 (×3): 50 ug via INTRAVENOUS
  Administered 2021-12-22: 100 ug via INTRAVENOUS
  Administered 2021-12-22: 50 ug via INTRAVENOUS

## 2021-12-22 MED ORDER — TRANEXAMIC ACID-NACL 1000-0.7 MG/100ML-% IV SOLN
INTRAVENOUS | Status: AC
  Filled 2021-12-22: qty 100

## 2021-12-22 MED ORDER — IBUPROFEN 600 MG PO TABS
600.0000 mg | ORAL_TABLET | Freq: Four times a day (QID) | ORAL | Status: DC
  Administered 2021-12-23 – 2021-12-24 (×5): 600 mg via ORAL
  Filled 2021-12-22 (×5): qty 1

## 2021-12-22 MED ORDER — ONDANSETRON HCL 4 MG/2ML IJ SOLN: 4.0000 mg | Freq: Four times a day (QID) | INTRAMUSCULAR | Status: DC | PRN

## 2021-12-22 MED ORDER — BUPIVACAINE HCL (PF) 0.25 % IJ SOLN
INTRAMUSCULAR | Status: AC
  Filled 2021-12-22: qty 30

## 2021-12-22 MED ORDER — PHENYLEPHRINE 80 MCG/ML (10ML) SYRINGE FOR IV PUSH (FOR BLOOD PRESSURE SUPPORT)
PREFILLED_SYRINGE | INTRAVENOUS | Status: DC | PRN
  Administered 2021-12-22: 160 ug via INTRAVENOUS
  Administered 2021-12-22: 80 ug via INTRAVENOUS

## 2021-12-22 MED ORDER — ONDANSETRON HCL 4 MG/2ML IJ SOLN
INTRAMUSCULAR | Status: AC
  Filled 2021-12-22: qty 6

## 2021-12-22 SURGICAL SUPPLY — 43 items
BARRIER ADHS 3X4 INTERCEED (GAUZE/BANDAGES/DRESSINGS) ×1 IMPLANT
BENZOIN TINCTURE PRP APPL 2/3 (GAUZE/BANDAGES/DRESSINGS) ×1 IMPLANT
CATH FOLEY 2WAY SLVR  5CC 16FR (CATHETERS) ×1
CATH FOLEY 2WAY SLVR 5CC 16FR (CATHETERS) IMPLANT
DILATOR CANAL MILEX (MISCELLANEOUS) ×1 IMPLANT
DRAIN PENROSE 1/2X12 LTX STRL (WOUND CARE) IMPLANT
DRAPE UNDERBUTTOCKS STRL (DISPOSABLE) ×1 IMPLANT
DRAPE WARM FLUID 44X44 (DRAPES) ×2 IMPLANT
DRSG OPSITE POSTOP 4X10 (GAUZE/BANDAGES/DRESSINGS) ×2 IMPLANT
DURAPREP 26ML APPLICATOR (WOUND CARE) ×2 IMPLANT
GAUZE 4X4 16PLY ~~LOC~~+RFID DBL (SPONGE) ×2 IMPLANT
GLOVE BIOGEL PI IND STRL 7.0 (GLOVE) ×4 IMPLANT
GLOVE BIOGEL PI INDICATOR 7.0 (GLOVE) ×4
GLOVE SURG SS PI 6.5 STRL IVOR (GLOVE) ×2 IMPLANT
GOWN STRL REUS W/ TWL LRG LVL3 (GOWN DISPOSABLE) ×3 IMPLANT
GOWN STRL REUS W/TWL LRG LVL3 (GOWN DISPOSABLE) ×3
HEMOSTAT ARISTA ABSORB 3G PWDR (HEMOSTASIS) IMPLANT
KIT TURNOVER KIT B (KITS) ×2 IMPLANT
MANIFOLD NEPTUNE II (INSTRUMENTS) ×2 IMPLANT
NDL HYPO 25GX1X1/2 BEV (NEEDLE) IMPLANT
NEEDLE HYPO 22GX1.5 SAFETY (NEEDLE) ×2 IMPLANT
NEEDLE HYPO 25GX1X1/2 BEV (NEEDLE) ×2 IMPLANT
NS IRRIG 1000ML POUR BTL (IV SOLUTION) ×2 IMPLANT
PACK ABDOMINAL GYN (CUSTOM PROCEDURE TRAY) ×2 IMPLANT
PAD ARMBOARD 7.5X6 YLW CONV (MISCELLANEOUS) ×2 IMPLANT
PAD OB MATERNITY 4.3X12.25 (PERSONAL CARE ITEMS) ×2 IMPLANT
RTRCTR C-SECT PINK 25CM LRG (MISCELLANEOUS) IMPLANT
SPONGE T-LAP 18X18 ~~LOC~~+RFID (SPONGE) ×2 IMPLANT
STRIP CLOSURE SKIN 1/2X4 (GAUZE/BANDAGES/DRESSINGS) ×1 IMPLANT
SUT CHROMIC 2 0 CT 1 (SUTURE) ×1 IMPLANT
SUT MON AB 4-0 PS1 27 (SUTURE) ×2 IMPLANT
SUT PLAIN 2 0 (SUTURE) ×1
SUT PLAIN 2 0 XLH (SUTURE) IMPLANT
SUT PLAIN ABS 2-0 CT1 27XMFL (SUTURE) IMPLANT
SUT VIC AB 0 CT1 27 (SUTURE) ×4
SUT VIC AB 0 CT1 27XBRD ANBCTR (SUTURE) ×4 IMPLANT
SUT VIC AB 0 CT1 36 (SUTURE) ×2 IMPLANT
SUT VIC AB 3-0 SH 27 (SUTURE) ×4
SUT VIC AB 3-0 SH 27X BRD (SUTURE) ×4 IMPLANT
SYR 10ML LL (SYRINGE) ×1 IMPLANT
SYR CONTROL 10ML LL (SYRINGE) ×3 IMPLANT
TOWEL GREEN STERILE FF (TOWEL DISPOSABLE) ×4 IMPLANT
TRAY FOLEY W/BAG SLVR 14FR (SET/KITS/TRAYS/PACK) ×2 IMPLANT

## 2021-12-22 NOTE — Anesthesia Preprocedure Evaluation (Addendum)
Anesthesia Evaluation  Patient identified by MRN, date of birth, ID band Patient awake    Reviewed: Allergy & Precautions, H&P , NPO status , Patient's Chart, lab work & pertinent test results  Airway Mallampati: II  TM Distance: >3 FB Neck ROM: Full    Dental no notable dental hx. (+) Teeth Intact, Dental Advisory Given   Pulmonary neg pulmonary ROS,    Pulmonary exam normal breath sounds clear to auscultation       Cardiovascular negative cardio ROS   Rhythm:Regular Rate:Normal     Neuro/Psych negative neurological ROS  negative psych ROS   GI/Hepatic negative GI ROS, Neg liver ROS,   Endo/Other  Hypothyroidism   Renal/GU negative Renal ROS  negative genitourinary   Musculoskeletal negative musculoskeletal ROS (+)   Abdominal   Peds  Hematology negative hematology ROS (+)   Anesthesia Other Findings   Reproductive/Obstetrics negative OB ROS                           Anesthesia Physical Anesthesia Plan  ASA: 2  Anesthesia Plan: General   Post-op Pain Management: Ofirmev IV (intra-op)* and Toradol IV (intra-op)*   Induction: Intravenous  PONV Risk Score and Plan: 4 or greater and Ondansetron, Dexamethasone and Midazolam  Airway Management Planned: Oral ETT  Additional Equipment:   Intra-op Plan:   Post-operative Plan: Extubation in OR  Informed Consent: I have reviewed the patients History and Physical, chart, labs and discussed the procedure including the risks, benefits and alternatives for the proposed anesthesia with the patient or authorized representative who has indicated his/her understanding and acceptance.     Dental advisory given  Plan Discussed with: CRNA  Anesthesia Plan Comments:         Anesthesia Quick Evaluation

## 2021-12-22 NOTE — Transfer of Care (Signed)
Immediate Anesthesia Transfer of Care Note  Patient: Toni Alvarez  Procedure(s) Performed: ABDOMINAL MYOMECTOMY  Patient Location: PACU  Anesthesia Type:General  Level of Consciousness: awake, drowsy and patient cooperative  Airway & Oxygen Therapy: Patient Spontanous Breathing  Post-op Assessment: Report given to RN, Post -op Vital signs reviewed and stable and Patient moving all extremities X 4  Post vital signs: Reviewed and stable  Last Vitals:  Vitals Value Taken Time  BP 155/98 12/22/21 1908  Temp    Pulse 97 12/22/21 1911  Resp 15 12/22/21 1911  SpO2 97 % 12/22/21 1911  Vitals shown include unvalidated device data.  Last Pain:  Vitals:   12/22/21 1429  TempSrc: Oral  PainSc: 3          Complications: No notable events documented.

## 2021-12-22 NOTE — Op Note (Incomplete)
Name:  Toni Alvarez DOB:   [birth year] MRN: [id number]  Pre-op diagnosis: 1. Intramural fibroids. 2. Heavy and painful periods. 3. Urine retention.    4. Urinary tract infection.    Post-op diagnosis: Same as above.   Procedures:  Abdominal myomectomy  Anesthesia:  General ETA, Dr. Roderic Palau  Surgeon: Dr. Waymon Amato  Assistant: Dr. Crawford Givens  Complications: None  Findings:  Uterus with multiple uterine fibroids, 3 intramural fibroids, 262 grams: One 10 cm fibroid located fundally and posteriorly and two small sub centimeter fibroids located anteriorly and to right side of uterus.   Normal bilateral ovaries and fallopian tubes. Normal bowels.    EBL: 50 cc  IVF: 700 LR  Urine output:  100 cc  Indications: 36 y/o P0 here for abdominal myomectomy for symptomatic fibroids.     PROCEDURE: Informed consent was obtained from the patient to undergo the procedure after explaining the risks, benefits and alternatives of the procedures.  She was prepped vaginally and abdominally and draped in the usual sterile fashion and a foley catheter was already placed in.    IV Ancef, IV tranexamic acid and rectal cytotec were given pre-operatively.  A 16 gauge foley was placed trans cervically and into the uterus and 10  cc of methylene blue dye instilled in the bulb.  Exparel/lidocaine mixture was given as a local injection on lower abdomen. A pfannenstiel incision was made with the scalpel and carried through the underlying subcutaneous layer and fascia with the bovie.  Small perforators were contained with the bovie.  The fascia was nicked in the midline and the fascia separated from the rectus muscles superiorly, inferiorly and bilaterally.  Rectus muscle was separated in the midline, peritoneum entered and the uterus was inspected and the fibroids noted.  The Alexis retractor was placed in after packing away the bowels.  The uterus was mobilized out of the pelvis with the  uterine screw placed fundally into the fibroid.  The above findings were noted.  Vasopressin (20 units mixed in 100 cc NS) was injected over the large posterior fibroid.  The fibroid was incised horizontally with the bovie and the fibroid and pseudocapsule was separated from the sorrounding myometrium with gauze pad and bovie.  Similar procedure was done to remove 2 more small intramural fibroids, each about 0.5 cm each.  The large uterine defect where the 10 cm posterior fibroid had been removed (defect located fundally) was then closed with 0-vicryl in 3 layers and then the serosa was closed off in baseball stitch with 3-0 vicryl.  Endometrial cavity was not entered with removal of the fibroids though the uterine foley bulb was palpated near the fibroid defect.  There was no spillage of methylene dye into the myometrium. The intrauterine foley was deflated and removed intact.  In case of future pregnancy a cesarean section is not required as the endometrial cavity was not entered, but is highly recommended as the fibroid defect was very close to the endometrial cavity.  The small uterine defects were closed with 3-0 vicryl  The pelvis was irrigated and suctioned out.   Interceed was then placed over  myometrium incisions where the fibroids had been removed.  Ubaldo Glassing was removed and the muscle and peritoneum was reapproximated with 0-chromic. The fascia was closed off using 0-vicryl in a running stitch. The subcutaneous layer was closed off with plain suture.  The skin was closed off with 4-0 vicryl in a subcuticular stitch.  Steri strips and honey comb dressing  were then placed over the incision.  The patient was awoken from anesthesia and taken to the recovery room in stable condition.  Dr. Waymon Amato.  12/22/2021.  8:05 PM.

## 2021-12-22 NOTE — Telephone Encounter (Signed)
Spoke with [facility] to ask if pt had been seen in their office as it appears she is scheduled for surgery with them today. Employee confirms that she was seen yesterday and is scheduled to have surgery today. Informed employee that pt called our office this morning requesting to be seen by [place] today. Advised that I will cancel pts upcoming appt with [place] since she has started care with [facility]

## 2021-12-22 NOTE — Interval H&P Note (Signed)
History and Physical Interval Note:  12/22/2021 4:06 PM  Toni Alvarez  has presented today for surgery, with the diagnosis of [place].  The various methods of treatment have been discussed with the patient and family. After consideration of risks, benefits and other options for treatment, the patient has consented to  Procedure(s): ABDOMINAL MYOMECTOMY (N/A) as a surgical intervention.  The patient's history has been reviewed, patient examined, no change in status, stable for surgery.  I have reviewed the patient's chart and labs.  Questions were answered to the patient's satisfaction.    Archie Endo, MD.  12/22/2021.

## 2021-12-22 NOTE — Anesthesia Postprocedure Evaluation (Signed)
Anesthesia Post Note  Patient: Toni Alvarez  Procedure(s) Performed: ABDOMINAL MYOMECTOMY     Patient location during evaluation: PACU Anesthesia Type: General Level of consciousness: awake and alert, patient cooperative and oriented Pain management: pain level controlled Vital Signs Assessment: post-procedure vital signs reviewed and stable Respiratory status: spontaneous breathing, nonlabored ventilation and respiratory function stable Cardiovascular status: blood pressure returned to baseline and stable Postop Assessment: no apparent nausea or vomiting Anesthetic complications: no   No notable events documented.  Last Vitals:  Vitals:   12/22/21 2005 12/22/21 2034  BP:  112/79  Pulse: 90 95  Resp: 11 10  Temp:    SpO2: 100% 100%    Last Pain:  Vitals:   12/22/21 2034  TempSrc:   PainSc: 0-No pain                 Lilianne Delair,E. Coleton Woon

## 2021-12-22 NOTE — H&P (Addendum)
Toni Alvarez is an 36 y.o. female with history of pelvic pain, heavy periods, urine retention and fibroids here for removal of the fibroids via abdominal myomectomy.   Pertinent Gynecological History: Menses:  Heavy and irregular Bleeding: heavy Contraception: none DES exposure: unknown Blood transfusions: none Sexually transmitted diseases: no past history Previous GYN Procedures:  None   Last mammogram: N/A Last pap: normal Date: 2022 OB History: G0 , P0   Menstrual History: LMP 12/21/2021.  Past Medical History:  Diagnosis Date   Cancer [facility])    Thyroid disease     Past Surgical History:  Procedure Laterality Date   THYROIDECTOMY  2017   TOTAL THYROIDECTOMY  2017   Due to papillary thyroid cancer    Family History  Problem Relation Age of Onset   Thyroid disease Sister     Social History:  reports that she has never smoked. She has never used smokeless tobacco. She reports current alcohol use. No history on file for drug use.  Allergies: No Known Allergies  Medications Prior to Admission  Medication Sig Dispense Refill Last Dose   ibuprofen (ADVIL) 800 MG tablet Take 800 mg by mouth every 8 (eight) hours as needed for mild pain.   12/22/2021   levothyroxine (SYNTHROID) 100 MCG tablet Take 1 tablet (100 mcg total) by mouth daily. 90 tablet 3 12/22/2021   naproxen sodium (ALEVE) 220 MG tablet Take 1 tablet (220 mg total) by mouth 2 (two) times daily as needed for up to 7 days. 14 tablet 0 Past Week   oxybutynin (DITROPAN) 5 MG tablet Take 5 mg by mouth 3 (three) times daily.   12/22/2021 at 0700   sulfamethoxazole-trimethoprim (BACTRIM) 400-80 MG tablet Take 1 tablet by mouth 2 (two) times daily.   12/22/2021    Review of Systems  Blood pressure 132/84, pulse (!) 107, resp. rate 18, height '4\' 11"'$  (1.499 m), weight 52 kg, last menstrual period 11/22/2021, SpO2 98 %. Physical Exam  Constitutional: She is oriented to person, place, and time. She appears  well-developed and well-nourished.  HENT:  Head: Normocephalic and atraumatic.  Neck: Normal range of motion.  Cardiovascular: Normal rate, regular rhythm and normal heart sounds.   Respiratory: Effort normal and breath sounds normal.  GI: Soft. Bowel sounds are normal.  Neurological: She is alert and oriented to person, place, and time.  Skin: Skin is warm and dry.  Psychiatric: She has a normal mood and affect. Her behavior is normal.   Results for orders placed or performed during the hospital encounter of 12/22/21 (from the past 24 hour(s))  Type and screen     Status: None   Collection Time: 12/22/21  2:25 PM  Result Value Ref Range   ABO/RH(D) O POS    Antibody Screen NEG    Sample Expiration      12/25/2021,2359 Performed at [facility], [place] [address] [place], [place] [address]   ABO/Rh     Status: None   Collection Time: 12/22/21  2:30 PM  Result Value Ref Range   ABO/RH(D)      O POS Performed at [place] [address] [place], [place] [address]   Pregnancy, urine POC     Status: None   Collection Time: 12/22/21  2:31 PM  Result Value Ref Range   Preg Test, Ur NEGATIVE NEGATIVE  CBC     Status: None   Collection Time: 12/22/21  2:38 PM  Result Value Ref Range  WBC 8.5 4.0 - 10.5 K/uL   RBC 4.67 3.87 - 5.11 MIL/uL   Hemoglobin 13.4 12.0 - 15.0 g/dL   HCT 41.8 36.0 - 46.0 %   MCV 89.5 80.0 - 100.0 fL   MCH 28.7 26.0 - 34.0 pg   MCHC 32.1 30.0 - 36.0 g/dL   RDW 13.5 11.5 - 15.5 %   Platelets 278 150 - 400 K/uL   nRBC 0.0 0.0 - 0.2 %    12/21/2021: Office Pelvic Ultrasound: Uterus with 10 cm intramural fibroid. EMS difficult to visualize.  Normal left and right ovaries.    Abdomen and pelvis CT scan:  IMPRESSION: 1. There is a large mass within the left posterior uterine corpus which has mass effect upon the lower uterine canal with a small volume of fluid within the mid and distal uterine cavity. Findings are favored to represent a  large fibroid. 2. Bilateral ovarian enlargement. This is a nonspecific finding. Recommend further evaluation with pelvic sonogram. 3. Right-sided pelvocaliectasis with hydroureter. This may reflect mass effect upon the distal right ureter due to the enlarged uterus. This could also be seen with recently passed stone or ascending urinary tract infection.    Electronically Signed   By: Kerby Moors M.D.   On: 12/17/2021 07:19  Assessment/Plan: 36 y/o with symptomatic fibroids and urine retention likely due to fibroid obstruction here for abdominal myomectomy, - Admit to [facility]. - This procedure has been fully reviewed with the patient and written informed consent has been obtained.  - Discussed with patient risks that include but are not limited to risks of bleeding, infection, damage to organs, blood transfusion, hysterectomy, possible need for cesarean delivery.  All her questions were answered and she expressed understanding.   Archie Endo, MD.  12/22/2021, 3:56 PM

## 2021-12-22 NOTE — Anesthesia Procedure Notes (Signed)
Procedure Name: Intubation Date/Time: 12/22/2021 4:10 PM  Performed by: Cathren Harsh, CRNAPre-anesthesia Checklist: Patient identified, Emergency Drugs available, Suction available and Patient being monitored Patient Re-evaluated:Patient Re-evaluated prior to induction Oxygen Delivery Method: Circle System Utilized Preoxygenation: Pre-oxygenation with 100% oxygen Induction Type: IV induction Ventilation: Mask ventilation without difficulty Laryngoscope Size: Mac and 3 Grade View: Grade II Tube type: Oral Tube size: 7.0 mm Number of attempts: 1 Airway Equipment and Method: Stylet and Oral airway Placement Confirmation: ETT inserted through vocal cords under direct vision, positive ETCO2 and breath sounds checked- equal and bilateral Secured at: 20 cm Tube secured with: Tape Dental Injury: Teeth and Oropharynx as per pre-operative assessment

## 2021-12-23 ENCOUNTER — Encounter (HOSPITAL_COMMUNITY): Payer: Self-pay | Admitting: Obstetrics & Gynecology

## 2021-12-23 LAB — BASIC METABOLIC PANEL
Anion gap: 10 (ref 5–15)
BUN: 7 mg/dL (ref 6–20)
CO2: 24 mmol/L (ref 22–32)
Calcium: 8.6 mg/dL — ABNORMAL LOW (ref 8.9–10.3)
Chloride: 100 mmol/L (ref 98–111)
Creatinine, Ser: 0.75 mg/dL (ref 0.44–1.00)
GFR, Estimated: >60 mL/min (ref >60)
Glucose, Bld: 209 mg/dL — ABNORMAL HIGH (ref 70–99)
Potassium: 4 mmol/L (ref 3.5–5.1)
Sodium: 134 mmol/L — ABNORMAL LOW (ref 135–145)

## 2021-12-23 MED ORDER — OXYCODONE HCL 5 MG PO TABS
5.0000 mg | ORAL_TABLET | ORAL | 0 refills | Status: DC | PRN
Start: 2021-12-23 — End: 2022-01-09

## 2021-12-23 NOTE — Discharge Instructions (Signed)
  Ms. Roy,  1. While at home remember to walk regularly, for at least 1 hour each a day, as this will help with your quick recovery.  You may also continue your usual activities of daily living such as taking showers, preparing food and light cleaning that require standing or walking around. When you take your showers keep them short and ensure you pat the incision/incision dressing dry after showering. Do not rub the incision/incision dressing or apply soap directly to the incision/incision dressing. Ensure that you rinse soap from the incision/incision dressing if used during showering.     2. You may remove the honey comb incision dressing in 1 week (next Friday). To do this spray water on the incision to get it wet then pill it off gently from the edges.  You will see steri strips below the dressing and you may leave the steri strips.  Continue with showering as usual and pat the incision and steri strips dry after showering. The steri strips will eventually fall off by themselves.   3. Do not do any heavy lifting, i.e nothing heavier than 15 lbs for the next 6 weeks.  4.  Do not use tampons or douche or take baths, do not have any sexual intercourse for the next 6 weeks.   5. Take your pain medication (over the counter tylenol, prescribed ibuprofen, prescribed oxycodone) as needed for pain. Let me know if your pain is not well controlled despite the pain medication.   6. Get plenty of rest for the next two weeks to allow your body to recover.  You may feel tired in the process, this is normal.  7. You may take an over the counter stool softener such as colace if you are constipated.     8.  If you feel fevers or chills please check your temperature and if equal to or greater than 100.4 please let me know.    9.  Please keep your upcoming appointment at the office as scheduled or the office will contact you within a few days to schedule one of you don't have one.   10. Do not drive if you  are having to take narcotics such as oxycodone.   11. Call me if with any questions or concerns.   I wish you a quick and safe recovery.  Dr. Waymon Amato [facility] [phone]  Office extension [phone].

## 2021-12-23 NOTE — Progress Notes (Addendum)
1 Day Post-Op Procedure(s) (LRB): ABDOMINAL MYOMECTOMY (N/A)  Subjective: Patient reports incisional pain, tolerating PO, + flatus, and no problems voiding.  Incisional pain improves with pain medication use. She is ambulating without difficulty.   Objective: Blood pressure 109/71, pulse 97, temperature 97.7 F (36.5 C), temperature source Oral, resp. rate 18, height '4\' 11"'$  (1.499 m), weight 52 kg, last menstrual period 11/22/2021, SpO2 95 %.     12/23/2021   11:45 AM 12/23/2021    8:06 AM 12/23/2021    3:55 AM  Vitals with BMI  Systolic 478 295 621  Diastolic 71 72 70  Pulse 97 110 101     07/13 0700  07/14 0659 07/14 0700  07/15 0659 07/15 0700  07/16 0659  I.V. (mL/kg)  2200 (42.3)   Total Intake(mL/kg)  2200 (42.3)   Urine (mL/kg/hr)  2850 800 (2.9)  Blood  50   Total Output  2900 800  Net  -700 -800        General: alert, cooperative, and no distress Resp: clear to auscultation bilaterally Cardio: regular rate and rhythm, S1, S2 normal, no murmur, click, rub or gallop GI: soft, non-tender; bowel sounds normal; no masses,  no organomegaly Extremities: extremities normal, atraumatic, no cyanosis or edema Vaginal Bleeding: minimal Incision: Clean, dry and intact. Small dried blood marked by pen towards right side of honey comb dressing.    CBC    Component Value Date/Time   WBC 17.5 (H) 12/22/2021 2050   RBC 4.18 12/22/2021 2050   HGB 12.4 12/22/2021 2050   HCT 36.6 12/22/2021 2050   PLT 216 12/22/2021 2050   MCV 87.6 12/22/2021 2050   MCH 29.7 12/22/2021 2050   MCHC 33.9 12/22/2021 2050   RDW 13.5 12/22/2021 2050   LYMPHSABS 1.7 12/17/2021 0553   MONOABS 0.7 12/17/2021 0553   EOSABS 0.0 12/17/2021 0553   BASOSABS 0.0 12/17/2021 0553      Latest Ref Rng & Units 12/23/2021    5:27 AM 12/17/2021    5:53 AM 06/23/2019    2:48 PM  BMP  Glucose 70 - 99 mg/dL 209  97  90   BUN 6 - 20 mg/dL '7  9  13   '$ Creatinine 0.44 - 1.00 mg/dL 0.75  0.57  0.62   Sodium 135 -  145 mmol/L 134  139  138   Potassium 3.5 - 5.1 mmol/L 4.0  3.9  3.7   Chloride 98 - 111 mmol/L 100  106  105   CO2 22 - 32 mmol/L '24  23  26   '$ Calcium 8.9 - 10.3 mg/dL 8.6  9.3  8.7      Assessment: s/p Procedure(s): ABDOMINAL MYOMECTOMY (N/A): progressing well  Plan: Encourage ambulation Advance to PO medication Discontinue IV fluids Continue ABX therapy due to UTI Discussed surgical procedure and intra-op findings.  Discharge home 12/24/2021.  Discussed precautions and post op follow up.  Check fasting fingerstick glucose in AM.    LOS: 1 day  Archie Endo, MD 12/23/2021, 12:14 PM

## 2021-12-23 NOTE — Discharge Summary (Signed)
Physician Discharge Summary  Patient ID: Toni Alvarez MRN: [id number] DOB/AGE: [birth year] 36 y.o.  Admit date: 12/22/2021 Discharge date: 12/24/2021  Admission Diagnoses: Intramural fibroids.  Heavy periods. Urine retention. Urinary tract infection. Pelvic pain.   Discharge Diagnoses:  Same as above.  Principal Problem:   Intramural leiomyoma of uterus Active Problems:   S/P myomectomy   Discharged Condition: good  Hospital Course: Patient was admitted for an abdominal myomectomy due to symptomatic fibroids.  Her procedure was performed without complications. Fibroid specimen was 262 grams. Post-operatively she did well and by post operative day 2 she was tolerating a regular diet and passing flatus. She was ambulating and voiding without difficulty.  Her abdominal pain was well controlled. Her vaginal bleeding was scant. She was deemed stable for discharge.   Consults: None  Significant Diagnostic Studies: labs:    Latest Ref Rng & Units 12/22/2021    8:50 PM 12/22/2021    2:38 PM 12/17/2021    5:53 AM  CBC  WBC 4.0 - 10.5 K/uL 17.5  8.5  16.9   Hemoglobin 12.0 - 15.0 g/dL 12.4  13.4  12.4   Hematocrit 36.0 - 46.0 % 36.6  41.8  37.3   Platelets 150 - 400 K/uL 216  278  317     Treatments: surgery: Abdominal myomectomy.   Discharge Exam:  General appearance: alert, cooperative, and no distress Cardio: regular rate and rhythm, S1, S2 normal, no murmur, click, rub or gallop GI: soft, non-tender; bowel sounds normal; no masses,  no organomegaly Pelvic: deferred and Vaginal bleeding is scant.  Extremities: extremities normal, atraumatic, no cyanosis or edema Incision/Wound: With dressing, mostly clean, dry and intact. Small dried blood on right edge of incision.   Disposition: To Home.   Allergies as of 12/23/2021   No Known Allergies   Med Rec must be completed prior to using this [facility]***         Signed: Archie Endo 12/23/2021, 12:10 PM

## 2021-12-24 LAB — GLUCOSE, CAPILLARY: Glucose-Capillary: 93 mg/dL (ref 70–99)

## 2021-12-24 NOTE — Progress Notes (Signed)
2 Days Post-Op Procedure(s) (LRB): ABDOMINAL MYOMECTOMY (N/A)  Subjective: Patient reports she is tolerating a regular diet and passing flatus.  Her incisional pain is well controlled with pain medication use.  She is ambulating and voiding.  Her vaginal bleeding is light.   With some right leg pain, whole leg, mild, improves with positional changes of the leg.   Objective: Blood pressure 116/78, pulse (!) 111, temperature 98.1 F (36.7 C), temperature source Oral, resp. rate 18, height '4\' 11"'$  (1.499 m), weight 52 kg, last menstrual period 11/22/2021, SpO2 98 %.      12/24/2021    9:01 AM 12/23/2021    8:12 PM 12/23/2021    3:11 PM  Vitals with BMI  Systolic 093 818 299  Diastolic 78 78 83  Pulse 371 95 112   Report View Graph  07/14 0700  07/15 0659 07/15 0700  07/16 0659 07/16 0700  07/17 0659  I.V. (mL/kg) 2200 (42.3) 0 (0)   Total Intake(mL/kg) 2200 (42.3) 0 (0)   Urine (mL/kg/hr) 2850 800 (0.6)   Blood 50    Total Output 2900 800   Net -700 -800    General: alert, cooperative, and no distress Resp: Breathing normally and without any strain.  Cardio: regular rate.  GI: soft, non-tender; bowel sounds normal; no masses,  no organomegaly and incision: clean, dry, intact, and small area with dried blood on right side of incision marked with pen. Stable.  Extremities: extremities normal, atraumatic, no cyanosis or edema Vaginal Bleeding: minimal Extremities: No erythema or tenderness on palpation of bilateral legs, no calf tenderness bilaterally.   Assessment: s/p Procedure(s): ABDOMINAL MYOMECTOMY (N/A): stable  Plan: Leg pain is likely secondary to positional effects during the surgery. She is advised to use tylenol and ibuprofen as needed, pain precautions reviewed.  Discharge home Reviewed post operative follow up and precautions.   LOS: 2 days  Archie Endo, MD 12/24/2021, 9:48 AM

## 2021-12-25 ENCOUNTER — Ambulatory Visit (HOSPITAL_BASED_OUTPATIENT_CLINIC_OR_DEPARTMENT_OTHER): Payer: 59 | Admitting: Obstetrics & Gynecology

## 2021-12-25 ENCOUNTER — Telehealth (HOSPITAL_BASED_OUTPATIENT_CLINIC_OR_DEPARTMENT_OTHER): Payer: Self-pay | Admitting: *Deleted

## 2021-12-25 ENCOUNTER — Other Ambulatory Visit: Payer: Self-pay | Admitting: Obstetrics & Gynecology

## 2021-12-25 DIAGNOSIS — D251 Intramural leiomyoma of uterus: Secondary | ICD-10-CM

## 2021-12-25 NOTE — Telephone Encounter (Signed)
-----   Message from Megan Salon, MD sent at 12/25/2021  6:38 AM EDT ----- Regarding: new pateint appt Pt did have abdominal myomectomy on Friday with Dr. Alesia Richards.  Could you call her.  I don't think she does not need an appt today as she is under the care of another physician and just had surgery.  Thanks.  Vinnie Level

## 2021-12-25 NOTE — Telephone Encounter (Signed)
LMOVM that since pt is under care of Dr. Alesia Richards, her appt today would not be needed and would be cancelled. Advised to call with any questions.

## 2021-12-26 ENCOUNTER — Telehealth (HOSPITAL_BASED_OUTPATIENT_CLINIC_OR_DEPARTMENT_OTHER): Payer: Self-pay

## 2021-12-26 LAB — SURGICAL PATHOLOGY

## 2021-12-26 NOTE — Telephone Encounter (Signed)
Entered in Error. tbw 

## 2021-12-28 ENCOUNTER — Other Ambulatory Visit: Payer: Self-pay

## 2021-12-28 ENCOUNTER — Other Ambulatory Visit: Payer: Self-pay | Admitting: Internal Medicine

## 2022-01-02 ENCOUNTER — Other Ambulatory Visit: Payer: Self-pay

## 2022-01-02 NOTE — Progress Notes (Unsigned)
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 is a 36 y.o., female with a past medical history of thyroid cancer , S/P total thyroidectomy . 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 She had a left thyroid nodule FNA consistent with papillary thyroid carcinoma. Her pathology report showed right papillary thyroid micro-carcinoma , classic and follicular variant 6.4P3.3x0.2 cm with no capsular invasion. Left lobe showed a papillary thyroid carcinoma , classic variant 1.0x1.0 x0.8 cm . Four L.N removed and were benign.    A whole body scan I -131 showed remnant thyroid bed with abnormal iodine avid lesion is noted in the mediastinum which could be due to physiologic thymic uptake.   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 Sisters with similar issues. She had laser hair removal a few years ago with regrowth of hair. LH, FSH, prolactin, testosterone and 17-OH progesterone were all normal.   SUBJECTIVE:    Today (01/04/2022):  Toni Alvarez is here for a follow up on postsurgical hypothyroidism .    Denies local neck symptoms  Has occasional palpitations  No diarrhea or constipation  Has occasional palpitations    S/P myomectomy 12/2021    Levothyroxine 100 mcg daily     HISTORY:  Past Medical History:  Past Medical History:  Diagnosis Date   Cancer ([place])    Thyroid disease     Past Surgical History:  Social History:  reports that she has never smoked. She has never used smokeless tobacco. She reports current alcohol use. No history on file for drug use. Family History:  Family History  Problem Relation Age of Onset   Thyroid disease Sister      HOME MEDICATIONS: Allergies as of 01/03/2022   No Known Allergies      Medication List        Accurate as of January 03, 2022 11:59 PM. If you have any questions, ask your nurse or doctor.          STOP taking these medications    sulfamethoxazole-trimethoprim 400-80 MG tablet Commonly known as: BACTRIM Stopped by: Dorita Sciara, MD       TAKE these medications    ibuprofen 800 MG tablet Commonly known as: ADVIL Take 800 mg by mouth every 8 (eight) hours as needed for mild pain.   levothyroxine 100 MCG tablet Commonly known as: SYNTHROID TAKE 1 TABLET(100 MCG) BY MOUTH DAILY   oxyCODONE 5 MG immediate release tablet Commonly known as: Oxy IR/ROXICODONE Take 1-2 tablets (5-10 mg total) by mouth every 4 (four) hours as needed for moderate pain.          OBJECTIVE:   PHYSICAL EXAM: VS: BP 104/66 (BP Location: Left Arm, Patient Position: Sitting, Cuff Size: Small)   Pulse 97   Ht '4\' 11"'$  (1.499 m)   Wt 110 lb (49.9 kg)   LMP 11/22/2021 (Approximate)   SpO2 99%   BMI 22.22 kg/m  EXAM: General: Pt appears well and is in NAD  Neck: General: Supple without adenopathy. Thyroid:   No goiter or nodules appreciated.   Lungs: Clear with good BS bilat with no rales, rhonchi, or wheezes  Heart: Auscultation: RRR.  Abdomen: Normoactive bowel sounds, soft, nontender, without masses or organomegaly palpable  Extremities:  BL LE: No pretibial edema normal ROM and strength.  Mental Status: Judgment, insight: Intact Orientation: Oriented to time, place, and person Mood and affect: No depression, anxiety, or agitation    DATA REVIEWED:   Latest Reference Range & Units 01/03/22  09:22  TSH 0.35 - 5.50 uIU/mL 5.43   Tg, Tg Ab's pending   Thyroid ultrasound 06/23/2019 Status post total thyroidectomy. No residual or significant finding by ultrasound.  ASSESSMENT / PLAN / RECOMMENDATIONS:   Hx of  Papillary thyroid carcinoma: (T1a, N0, Mx ) Stage I      - Pt with excellent response to treatment  - She is at a low risk for recurrence  - Tg and Tg Ab negative in the past  - TSH goal 0.5-2.0 uIU/mL  - Repeat thyroid bed ultrasound is negative 2021 will repeat this year  -Her TSH is elevated which is unusual given that she has been well controlled on the current dose of levothyroxine, I suspect imperfect adherence to levothyroxine -I have asked her to use a pillbox to ensure daily intake of levothyroxine -We will recheck TFTs in 2 months     2. Postsurgical Hypothyroidism :      -TSH elevated -No changes at this time, but she was advised to use a pillbox and we will recheck in 2 months  Continue levothyroxine 100 mcg daily       F/U in 1 yr    Signed electronically by: Mack Guise, MD  [facility]  [facility] [address] [place], [place] [address] Phone: [phone] FAX: [phone]      CC: Keith Rake, MD [address] [place] [address] Phone: [phone]  Fax: [phone]   Return to Endocrinology clinic as below: Future Appointments  Date Time Provider [place]  01/31/2022  4:00 PM [facility] [place] [facility]  01/04/2023  9:10 AM Carlton Buskey, Melanie Crazier, MD [facility] None

## 2022-01-03 ENCOUNTER — Encounter: Payer: Self-pay | Admitting: Internal Medicine

## 2022-01-03 ENCOUNTER — Ambulatory Visit (INDEPENDENT_AMBULATORY_CARE_PROVIDER_SITE_OTHER): Payer: Commercial Managed Care - HMO | Admitting: Internal Medicine

## 2022-01-03 VITALS — BP 104/66 | HR 97 | Ht 59.0 in | Wt 110.0 lb

## 2022-01-03 DIAGNOSIS — Z85850 Personal history of malignant neoplasm of thyroid: Secondary | ICD-10-CM

## 2022-01-03 DIAGNOSIS — E890 Postprocedural hypothyroidism: Secondary | ICD-10-CM

## 2022-01-03 LAB — TSH: TSH: 5.43 u[IU]/mL (ref 0.35–5.50)

## 2022-01-03 NOTE — Patient Instructions (Signed)

## 2022-01-04 LAB — THYROGLOBULIN ANTIBODY: Thyroglobulin Ab: <1 IU/mL (ref <=1)

## 2022-01-04 LAB — THYROGLOBULIN LEVEL: Thyroglobulin: <0.1 ng/mL — ABNORMAL LOW

## 2022-01-04 MED ORDER — LEVOTHYROXINE SODIUM 100 MCG PO TABS
100.0000 ug | ORAL_TABLET | Freq: Every day | ORAL | 3 refills | Status: DC
Start: 2022-01-04 — End: 2023-03-19

## 2022-01-09 ENCOUNTER — Other Ambulatory Visit: Payer: Self-pay

## 2022-01-09 ENCOUNTER — Emergency Department (HOSPITAL_COMMUNITY): Payer: Commercial Managed Care - HMO

## 2022-01-09 ENCOUNTER — Emergency Department (HOSPITAL_COMMUNITY)
Admission: EM | Admit: 2022-01-09 | Discharge: 2022-01-09 | Disposition: A | Discharge status: Home / Self Care | Payer: Commercial Managed Care - HMO | Attending: Emergency Medicine | Admitting: Emergency Medicine

## 2022-01-09 ENCOUNTER — Encounter (HOSPITAL_COMMUNITY): Payer: Self-pay | Admitting: Emergency Medicine

## 2022-01-09 DIAGNOSIS — R42 Dizziness and giddiness: Secondary | ICD-10-CM | POA: Diagnosis not present

## 2022-01-09 DIAGNOSIS — R0602 Shortness of breath: Secondary | ICD-10-CM | POA: Insufficient documentation

## 2022-01-09 DIAGNOSIS — R002 Palpitations: Secondary | ICD-10-CM | POA: Insufficient documentation

## 2022-01-09 LAB — CBC WITH DIFFERENTIAL/PLATELET
Abs Immature Granulocytes: 0.04 10*3/uL (ref 0.00–0.07)
Basophils Absolute: 0 10*3/uL (ref 0.0–0.1)
Basophils Relative: 0 %
Eosinophils Absolute: 0.1 10*3/uL (ref 0.0–0.5)
Eosinophils Relative: 1 %
HCT: 41 % (ref 36.0–46.0)
Hemoglobin: 13.1 g/dL (ref 12.0–15.0)
Immature Granulocytes: 0 %
Lymphocytes Relative: 28 %
Lymphs Abs: 2.8 10*3/uL (ref 0.7–4.0)
MCH: 28.7 pg (ref 26.0–34.0)
MCHC: 32 g/dL (ref 30.0–36.0)
MCV: 89.7 fL (ref 80.0–100.0)
Monocytes Absolute: 0.4 10*3/uL (ref 0.1–1.0)
Monocytes Relative: 4 %
Neutro Abs: 6.8 10*3/uL (ref 1.7–7.7)
Neutrophils Relative %: 67 %
Platelets: 376 10*3/uL (ref 150–400)
RBC: 4.57 MIL/uL (ref 3.87–5.11)
RDW: 13.2 % (ref 11.5–15.5)
WBC: 10.2 10*3/uL (ref 4.0–10.5)
nRBC: 0 % (ref 0.0–0.2)

## 2022-01-09 LAB — URINALYSIS, ROUTINE W REFLEX MICROSCOPIC
Bilirubin Urine: NEGATIVE
Glucose, UA: NEGATIVE mg/dL
Hgb urine dipstick: NEGATIVE
Ketones, ur: NEGATIVE mg/dL
Leukocytes,Ua: NEGATIVE
Nitrite: NEGATIVE
Protein, ur: NEGATIVE mg/dL
Specific Gravity, Urine: 1.001 — ABNORMAL LOW (ref 1.005–1.030)
pH: 6 (ref 5.0–8.0)

## 2022-01-09 LAB — PREGNANCY, URINE: Preg Test, Ur: NEGATIVE

## 2022-01-09 LAB — COMPREHENSIVE METABOLIC PANEL
ALT: 48 U/L — ABNORMAL HIGH (ref 0–44)
AST: 20 U/L (ref 15–41)
Albumin: 4.2 g/dL (ref 3.5–5.0)
Alkaline Phosphatase: 58 U/L (ref 38–126)
Anion gap: 10 (ref 5–15)
BUN: 6 mg/dL (ref 6–20)
CO2: 24 mmol/L (ref 22–32)
Calcium: 9.1 mg/dL (ref 8.9–10.3)
Chloride: 103 mmol/L (ref 98–111)
Creatinine, Ser: 0.58 mg/dL (ref 0.44–1.00)
GFR, Estimated: >60 mL/min (ref >60)
Glucose, Bld: 112 mg/dL — ABNORMAL HIGH (ref 70–99)
Potassium: 3.5 mmol/L (ref 3.5–5.1)
Sodium: 137 mmol/L (ref 135–145)
Total Bilirubin: 0.5 mg/dL (ref 0.3–1.2)
Total Protein: 8.1 g/dL (ref 6.5–8.1)

## 2022-01-09 LAB — D-DIMER, QUANTITATIVE: D-Dimer, Quant: 0.58 ug/mL-FEU — ABNORMAL HIGH (ref 0.00–0.50)

## 2022-01-09 MED ORDER — SODIUM CHLORIDE 0.9 % IV BOLUS
1000.0000 mL | Freq: Once | INTRAVENOUS | Status: AC
  Administered 2022-01-09: 1000 mL via INTRAVENOUS

## 2022-01-09 MED ORDER — IOHEXOL 350 MG/ML SOLN
100.0000 mL | Freq: Once | INTRAVENOUS | Status: AC | PRN
Start: 2022-01-09 — End: 2022-01-09
  Administered 2022-01-09: 60 mL via INTRAVENOUS

## 2022-01-09 NOTE — ED Notes (Signed)
Pt transported to CT ?

## 2022-01-09 NOTE — ED Notes (Signed)
Pt returned from CT °

## 2022-01-09 NOTE — ED Notes (Signed)
Discharge instructions reviewed with patient. Patient denies any further questions or concerns. Patient ambulatory out of ED.

## 2022-01-09 NOTE — ED Triage Notes (Signed)
Pt reported to ED with c/o of possible anxiety attack, stating she woke up this morning and felt short of breath, dizzy and her heart was racing. Pt states she feels symptoms are related to recently starting antibiotic for UTI post uterine surgery. Also states she was having pain and weakness in legs.

## 2022-01-09 NOTE — Discharge Instructions (Addendum)
You were seen in the emergency department for your heart palpitations and your dizziness after taking your antibiotic.  You had a work-up performed that showed significant signs of dehydration, no abnormal heart rhythms.  Your inflammatory marker to assess for your risk of blood clots was mildly elevated so we had a CAT scan performed that showed no signs of blood clots in your chest.  You also had no signs of anemia from your recent surgery.  Your urine did not show any signs of infection today.  I would stop taking the ciprofloxacin as you have no signs of urinary tract infection.  You should take your levofloxacin as prescribed and Tylenol or Motrin for your postsurgical pain.  You should make sure that you are drinking plenty of fluids.  You should follow-up with urology as planned as well as your primary doctor to have your symptoms rechecked.  You should return to the emergency department if you pass out, you have worsening chest pain or shortness of breath, you have repetitive vomiting, you notice pus draining from your wound, you have severe abdominal pain or if you have any other new or concerning symptoms.

## 2022-01-09 NOTE — ED Provider Notes (Signed)
[place] [facility] EMERGENCY DEPARTMENT Provider Note   CSN: [id number] Arrival date & time: 01/09/22  0606     History  Chief Complaint  Patient presents with   Dizziness    Toni Alvarez is a 36 y.o. female.  Patient is a 36 year old female with a past medical history of previous thyroid cancer status post thyroidectomy, leiomyoma status post myomectomy last month senting to the emergency department with shortness of breath and heart palpitations.  The patient states that she had a postop appointment last week after her myomectomy and was diagnosed with a UTI.  She was started on Cipro   Dizziness      Home Medications Prior to Admission medications   Medication Sig Start Date End Date Taking? Authorizing Provider  ciprofloxacin (CIPRO) 500 MG tablet Take 500 mg by mouth 2 (two) times daily. 01/08/22  Yes [provider]  levothyroxine (SYNTHROID) 100 MCG tablet Take 1 tablet (100 mcg total) by mouth daily before breakfast. 01/04/22  Yes Shamleffer, Melanie Crazier, MD      Allergies    Patient has no known allergies.    Review of Systems   Review of Systems  Neurological:  Positive for dizziness.    Physical Exam Updated Vital Signs BP 120/89   Pulse 99   Temp 98 F (36.7 C) (Oral)   Resp 15   LMP 11/22/2021 (Approximate)   SpO2 99%  Physical Exam  ED Results / Procedures / Treatments   Labs (all labs ordered are listed, but only abnormal results are displayed) Labs Reviewed  COMPREHENSIVE METABOLIC PANEL - Abnormal; Notable for the following components:      Result Value   Glucose, Bld 112 (*)    ALT 48 (*)    All other components within normal limits  URINALYSIS, ROUTINE W REFLEX MICROSCOPIC - Abnormal; Notable for the following components:   Color, Urine COLORLESS (*)    Specific Gravity, Urine 1.001 (*)    All other components within normal limits  D-DIMER, QUANTITATIVE - Abnormal; Notable for the following components:    D-Dimer, Quant 0.58 (*)    All other components within normal limits  CBC WITH DIFFERENTIAL/PLATELET  PREGNANCY, URINE    EKG EKG Interpretation  Date/Time:  Tuesday January 09 2022 06:29:05 EDT Ventricular Rate:  124 PR Interval:  128 QRS Duration: 70 QT Interval:  310 QTC Calculation: 445 R Axis:   72 Text Interpretation: Sinus tachycardia Otherwise normal ECG When compared with ECG of 28-Oct-2021 23:46, PREVIOUS ECG IS PRESENT No significant change from prior EKG Confirmed by Oneal Deputy [phone] on 01/09/2022 12:09:51 PM  Radiology CT Angio Chest PE W and/or Wo Contrast  Result Date: 01/09/2022 CLINICAL DATA:  Dizziness and elevated D-dimer. EXAM: CT ANGIOGRAPHY CHEST WITH CONTRAST TECHNIQUE: Multidetector CT imaging of the chest was performed using the standard protocol during bolus administration of intravenous contrast. Multiplanar CT image reconstructions and MIPs were obtained to evaluate the vascular anatomy. RADIATION DOSE REDUCTION: This exam was performed according to the departmental dose-optimization program which includes automated exposure control, adjustment of the mA and/or kV according to patient size and/or use of iterative reconstruction technique. CONTRAST:  30m OMNIPAQUE IOHEXOL 350 MG/ML SOLN COMPARISON:  Chest x-ray dated Oct 29, 2021. FINDINGS: Cardiovascular: Satisfactory opacification of the pulmonary arteries to the segmental level. No evidence of pulmonary embolism. Normal heart size. No pericardial effusion. No thoracic aortic aneurysm or dissection. 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 No enlarged mediastinal, hilar, or axillary lymph  nodes. Prior thyroidectomy. Trachea and esophagus demonstrate no significant findings. Lungs/Pleura: Lungs are clear. No pleural effusion or pneumothorax. Upper Abdomen: No acute abnormality. 8 mm round enhancing lesion in the central liver likely represents a flash filling hemangioma.  Musculoskeletal: No chest wall abnormality. No acute or significant osseous findings. Review of the MIP images confirms the above findings. IMPRESSION: 1. No evidence of pulmonary embolism. No acute intrathoracic process. Electronically Signed   By: Titus Dubin M.D.   On: 01/09/2022 13:50    Procedures Procedures    Medications Ordered in ED Medications  sodium chloride 0.9 % bolus 1,000 mL (0 mLs Intravenous Stopped 01/09/22 1243)  iohexol (OMNIPAQUE) 350 MG/ML injection 100 mL (60 mLs Intravenous Contrast Given 01/09/22 1342)    ED Course/ Medical Decision Making/ A&P Clinical Course as of 01/09/22 1430  Tue Jan 09, 2022  1410 CTPE negative for PE [VK]  1413 Patient thyroid function performed 4 days ago showed hypothyroidism making her tachycardia unlikely to be related to her thyroid.  Her urine today does not show signs of infection and is unclear why she was started on ciprofloxacin after recently completing a course of Bactrim for UTI after her surgery 2 weeks ago. [VK]  1426 Upon reassessment, The patient has improvement of her symptoms.  The patient reports that she talked to the on-call doctor for her gynecologist stating that she was still having pain from the surgery and that is why she was started on the ciprofloxacin.  She states that her urine was not tested at that time.  Urine today is negative for UTI and she was recommended to discontinue the ciprofloxacin due to the adverse effects that she received from taking the antibiotic.  She states that she has follow-up with urology planned and plans to follow-up with her gynecologist and primary doctor as well.  She states that she is feeling back to her baseline. [VK]  1427 Patient is stable for discharge home and she was given strict return precautions. [VK]    Clinical Course User Index [VK] Ottie Glazier, DO                           Medical Decision Making Amount and/or Complexity of Data Reviewed External Data  Reviewed: labs and notes.    Details: Reviewed patient's outside records.  She did see her endocrinologist on 7/16 and her labs showed that she is hypothyroid.  This is thought to be due to medication noncompliance and her endocrinologist did not want to make any changes to her medication at this time.  Patient was initially discharged after her surgery on Bactrim and was prescribed ciprofloxacin this weekend.  It is unclear who prescribed her new antibiotic prescription.          Final Clinical Impression(s) / ED Diagnoses Final diagnoses:  Dizziness  Palpitations    Rx / DC Orders ED Discharge Orders     None         Ottie Glazier, DO 01/09/22 1430

## 2022-01-09 NOTE — ED Provider Triage Note (Signed)
  Emergency Medicine Provider Triage Evaluation Note  MRN:  [id number]  Arrival date & time: 01/09/22    Medically screening exam initiated at 6:19 AM.   CC:   Dizziness   HPI:  Toni Alvarez is a 36 y.o. year-old female presents to the ED with chief complaint of dizziness and lightheadedness.  States that she's had some associated SOB and racing heart.  Had recent uterine myomectomy.  Currently being treated for UTI with cipro.  Denies history of PE.  History provided by patient. ROS:  -As included in HPI PE:   Vitals:   01/09/22 0614  BP: (!) 157/97  Pulse: (!) 129  Resp: 20  Temp: 98 F (36.7 C)  SpO2: 99%    Non-toxic appearing No respiratory distress Tachycardic anxious MDM:   I've ordered labs in triage to expedite lab/diagnostic workup.  Patient was informed that the remainder of the evaluation will be completed by another provider, this initial triage assessment does not replace that evaluation, and the importance of remaining in the ED until their evaluation is complete.    Montine Circle, PA-C 01/09/22 [phone]

## 2022-01-22 ENCOUNTER — Encounter: Payer: Self-pay | Admitting: Internal Medicine

## 2022-01-31 ENCOUNTER — Other Ambulatory Visit: Payer: 59

## 2022-02-07 ENCOUNTER — Ambulatory Visit (HOSPITAL_BASED_OUTPATIENT_CLINIC_OR_DEPARTMENT_OTHER): Payer: 59 | Admitting: Obstetrics & Gynecology

## 2022-03-09 ENCOUNTER — Ambulatory Visit
Admission: RE | Admit: 2022-03-09 | Discharge: 2022-03-09 | Disposition: A | Discharge status: Home / Self Care | Payer: Commercial Managed Care - HMO | Source: Ambulatory Visit | Attending: Internal Medicine | Admitting: Internal Medicine

## 2022-03-09 ENCOUNTER — Other Ambulatory Visit (INDEPENDENT_AMBULATORY_CARE_PROVIDER_SITE_OTHER): Payer: Commercial Managed Care - HMO

## 2022-03-09 ENCOUNTER — Other Ambulatory Visit: Payer: Self-pay | Admitting: Internal Medicine

## 2022-03-09 DIAGNOSIS — E890 Postprocedural hypothyroidism: Secondary | ICD-10-CM | POA: Diagnosis not present

## 2022-03-09 DIAGNOSIS — Z85850 Personal history of malignant neoplasm of thyroid: Secondary | ICD-10-CM

## 2022-03-09 LAB — COMPREHENSIVE METABOLIC PANEL
ALT: 20 U/L (ref 0–35)
AST: 12 U/L (ref 0–37)
Albumin: 4.4 g/dL (ref 3.5–5.2)
Alkaline Phosphatase: 53 U/L (ref 39–117)
BUN: 9 mg/dL (ref 6–23)
CO2: 26 mEq/L (ref 19–32)
Calcium: 8.8 mg/dL (ref 8.4–10.5)
Chloride: 103 mEq/L (ref 96–112)
Creatinine, Ser: 0.6 mg/dL (ref 0.40–1.20)
GFR: 116.83 mL/min (ref >60.00)
Glucose, Bld: 96 mg/dL (ref 70–99)
Potassium: 4 mEq/L (ref 3.5–5.1)
Sodium: 137 mEq/L (ref 135–145)
Total Bilirubin: 0.9 mg/dL (ref 0.2–1.2)
Total Protein: 7.4 g/dL (ref 6.0–8.3)

## 2022-03-09 LAB — T4, FREE: Free T4: 1.35 ng/dL (ref 0.60–1.60)

## 2022-03-09 LAB — TSH: TSH: 1.72 u[IU]/mL (ref 0.35–5.50)

## 2022-04-02 ENCOUNTER — Other Ambulatory Visit: Payer: Self-pay | Admitting: Obstetrics & Gynecology

## 2022-04-02 DIAGNOSIS — R102 Pelvic and perineal pain: Secondary | ICD-10-CM

## 2022-04-02 DIAGNOSIS — R109 Unspecified abdominal pain: Secondary | ICD-10-CM

## 2022-04-16 ENCOUNTER — Ambulatory Visit
Admission: RE | Admit: 2022-04-16 | Discharge: 2022-04-16 | Disposition: A | Discharge status: Home / Self Care | Payer: Commercial Managed Care - HMO | Source: Ambulatory Visit | Attending: Obstetrics & Gynecology | Admitting: Obstetrics & Gynecology

## 2022-04-16 DIAGNOSIS — R109 Unspecified abdominal pain: Secondary | ICD-10-CM

## 2022-04-16 DIAGNOSIS — R102 Pelvic and perineal pain: Secondary | ICD-10-CM

## 2022-12-30 ENCOUNTER — Encounter: Payer: Self-pay | Admitting: Internal Medicine

## 2023-01-04 ENCOUNTER — Ambulatory Visit: Payer: Self-pay | Admitting: Internal Medicine

## 2023-03-19 ENCOUNTER — Telehealth: Payer: Self-pay | Admitting: Internal Medicine

## 2023-03-19 MED ORDER — LEVOTHYROXINE SODIUM 100 MCG PO TABS: 100.0000 ug | ORAL_TABLET | Freq: Every day | ORAL | 0 refills | Status: DC

## 2023-03-19 NOTE — Telephone Encounter (Signed)
MEDICATION:  levothyroxine levothyroxine (SYNTHROID) 100 MCG tablet  PHARMACY:    [id number] - [place], [place] - [address] AT [facility] OF [address] & [address] (Ph: [phone])    HAS THE PATIENT CONTACTED THEIR PHARMACY?  Yes  IS THIS A 90 DAY SUPPLY : Yes  IS PATIENT OUT OF MEDICATION: Yes  IF NOT; HOW MUCH IS LEFT:   LAST APPOINTMENT DATE: @7 /26/2023  NEXT APPOINTMENT DATE:@10 /21/2024  DO WE HAVE YOUR PERMISSION TO LEAVE A DETAILED MESSAGE?:Yes  OTHER COMMENTS:    **Let patient know to contact pharmacy at the end of the day to make sure medication is ready. **  ** Please notify patient to allow 48-72 hours to process**  **Encourage patient to contact the pharmacy for refills or they can request refills through [facility]**

## 2023-04-01 ENCOUNTER — Ambulatory Visit: Payer: Self-pay | Admitting: Internal Medicine

## 2023-04-01 NOTE — Progress Notes (Deleted)
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 is a 37 y.o., female with a past medical history of thyroid cancer , S/P total thyroidectomy . 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 She had a left thyroid nodule FNA consistent with papillary thyroid carcinoma. Her pathology report showed right papillary thyroid micro-carcinoma , classic and follicular variant 0.4x0.3x0.2 cm with no capsular invasion. Left lobe showed a papillary thyroid carcinoma , classic variant 1.0x1.0 x0.8 cm . Four L.N removed and were benign.    A whole body scan I -131 showed remnant thyroid bed with abnormal iodine avid lesion is noted in the mediastinum which could be due to physiologic thymic uptake.   She studied at 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 Sisters with similar issues. She had laser hair removal a few years ago with regrowth of hair. LH, FSH, prolactin, testosterone and 17-OH progesterone were all normal.   SUBJECTIVE:    Today (04/01/2023):  [name] is here for a follow up on postsurgical hypothyroidism .    Denies local neck symptoms  Has occasional palpitations  No diarrhea or constipation  Has occasional palpitations    S/P myomectomy 12/2021    Levothyroxine 100 mcg daily     HISTORY:  Past Medical History:  Past Medical History:  Diagnosis Date   Cancer (HCC)    Thyroid disease    Past Surgical  History:  Social History:  reports that she has never smoked. She has never used smokeless tobacco. She reports current alcohol use. No history on file for drug use. Family History:  Family History  Problem Relation Age of Onset   Thyroid disease Sister      HOME MEDICATIONS: Allergies as of 04/01/2023   No Known Allergies      Medication List        Accurate as of April 01, 2023  7:10 AM. If you have any questions, ask your nurse or doctor.          ciprofloxacin 500 MG tablet Commonly known as: CIPRO Take 500 mg by mouth 2 (two) times daily.   levothyroxine 100 MCG tablet Commonly known as: SYNTHROID Take 1 tablet (100 mcg total) by mouth daily before breakfast.          OBJECTIVE:   PHYSICAL EXAM: VS: There were no vitals taken for this visit.   EXAM: General: Pt appears well and is in NAD  Neck: General: Supple without adenopathy. Thyroid:   No goiter or nodules appreciated.   Lungs: Clear with good BS bilat with no rales, rhonchi, or wheezes  Heart: Auscultation: RRR.  Abdomen: Normoactive bowel sounds, soft, nontender, without masses or organomegaly palpable  Extremities:  BL LE: No pretibial edema normal ROM and strength.  Mental Status: Judgment, insight: Intact Orientation: Oriented to time, place, and person Mood and affect: No depression, anxiety, or agitation    DATA REVIEWED:   Latest Reference Range & Units 01/03/22 09:22  TSH  0.35 - 5.50 uIU/mL 5.43   Tg, Tg Ab's pending   Thyroid ultrasound 03/09/2022  The thyroid gland is surgically absent. Sonographic evaluation of the right and left thyroid resection bed demonstrates no evidence of residual or recurrent thyroid tissue. No nodules or lymphadenopathy.   IMPRESSION: Surgical changes of total thyroidectomy without evidence of residual or recurrent thyroid tissue.     ASSESSMENT / PLAN / RECOMMENDATIONS:   Hx of  Papillary thyroid carcinoma: (T1a, N0, Mx ) Stage I      -  Pt with excellent response to treatment  - She is at a low risk for recurrence  - Tg and Tg Ab negative in the past  - TSH goal 0.5-2.0 uIU/mL  - Repeat thyroid bed ultrasound is negative 2023 -    2. Postsurgical Hypothyroidism :      -TSH elevated -No changes at this time, but she was advised to use a pillbox and we will recheck in 2 months  Continue levothyroxine 100 mcg daily       F/U in 1 yr    Signed electronically by: Lyndle Herrlich, MD  [facility]  [facility] [address] [place] [place], [place] [address] Phone: [phone] FAX: [phone]      CC: Blenda Mounts, MD [address] [place] [address] Phone: [phone]  Fax: [phone]   Return to Endocrinology clinic as below: Future Appointments  Date Time Provider Department Center  04/01/2023  9:30 AM Willford Rabideau, Konrad Dolores, MD [facility] None

## 2023-04-15 ENCOUNTER — Telehealth: Payer: Self-pay | Admitting: Internal Medicine

## 2023-04-15 MED ORDER — LEVOTHYROXINE SODIUM 100 MCG PO TABS: 100.0000 ug | ORAL_TABLET | Freq: Every day | ORAL | 0 refills | Status: AC

## 2023-04-15 NOTE — Telephone Encounter (Signed)
MEDICATION: Levothyroxine   PHARMACY:  [facility] on [address] and [address]  HAS THE PATIENT CONTACTED THEIR PHARMACY?  yes  IS THIS A 90 DAY SUPPLY : no  IS PATIENT OUT OF MEDICATION: yes  IF NOT; HOW MUCH IS LEFT:   LAST APPOINTMENT DATE: @07 -26-23  NEXT APPOINTMENT DATE:@11 /01/2023  DO WE HAVE YOUR PERMISSION TO LEAVE A DETAILED MESSAGE?:  OTHER COMMENTS:  patient was advised to keep appointment on 04/19/23

## 2023-04-16 ENCOUNTER — Other Ambulatory Visit: Payer: Self-pay | Admitting: Internal Medicine

## 2023-04-19 ENCOUNTER — Ambulatory Visit: Payer: Self-pay | Admitting: Internal Medicine

## 2023-04-19 NOTE — Progress Notes (Deleted)
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 She had a left thyroid nodule FNA consistent with papillary thyroid carcinoma. Her pathology report showed right papillary thyroid micro-carcinoma , classic and follicular variant 0.4x0.3x0.2 cm with no capsular invasion. Left lobe showed a papillary thyroid carcinoma , classic variant 1.0x1.0 x0.8 cm . Four L.N removed and were benign.    A whole body scan I -131 showed remnant thyroid bed with abnormal iodine avid lesion is noted in the mediastinum which could be due to physiologic thymic uptake.   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 Sisters with similar issues. She had laser hair removal a few years ago with regrowth of hair. LH, FSH, prolactin, testosterone and 17-OH progesterone were all normal.   SUBJECTIVE:    Today (04/19/2023):  Toni Alvarez is here for a follow up on postsurgical hypothyroidism .    Denies local neck symptoms  Has occasional palpitations  No diarrhea or constipation  Has occasional palpitations    S/P myomectomy 12/2021    Levothyroxine 100 mcg daily     HISTORY:  Past Medical History:  Past Medical History:  Diagnosis Date   Cancer (HCC)    Thyroid disease    Past Surgical  History:  Social History:  reports that she has never smoked. She has never used smokeless tobacco. She reports current alcohol use. No history on file for drug use. Family History:  Family History  Problem Relation Age of Onset   Thyroid disease Sister      HOME MEDICATIONS: Allergies as of 04/19/2023   No Known Allergies      Medication List        Accurate as of April 19, 2023  6:54 AM. If you have any questions, ask your nurse or doctor.          ciprofloxacin 500 MG tablet Commonly known as: CIPRO Take 500 mg by mouth 2 (two) times daily.   levothyroxine 100 MCG tablet Commonly known as: SYNTHROID Take 1 tablet (100 mcg total) by mouth daily before breakfast.          OBJECTIVE:   PHYSICAL EXAM: VS: There were no vitals taken for this visit.   EXAM: General: Pt appears well and is in NAD  Neck: General: Supple without adenopathy. Thyroid:   No goiter or nodules appreciated.   Lungs: Clear with good BS bilat with no rales, rhonchi, or wheezes  Heart: Auscultation: RRR.  Abdomen: Normoactive bowel sounds, soft, nontender, without masses or organomegaly palpable  Extremities:  BL LE: No pretibial edema normal ROM and strength.  Mental Status: Judgment, insight: Intact Orientation: Oriented to time, place, and person Mood and affect: No depression, anxiety, or agitation    DATA REVIEWED:   Latest Reference Range & Units 01/03/22 09:22  TSH  0.35 - 5.50 uIU/mL 5.43   Tg, Tg Ab's pending   Thyroid ultrasound 03/09/2022  The thyroid gland is surgically absent. Sonographic evaluation of the right and left thyroid resection bed demonstrates no evidence of residual or recurrent thyroid tissue. No nodules or lymphadenopathy.   IMPRESSION: Surgical changes of total thyroidectomy without evidence of residual or recurrent thyroid tissue.     ASSESSMENT / PLAN / RECOMMENDATIONS:   Hx of  Papillary thyroid carcinoma: (T1a, N0, Mx ) Stage I      -  Pt with excellent response to treatment  - She is at a low risk for recurrence  - Tg and Tg Ab negative in the past  - TSH goal 0.5-2.0 uIU/mL  - Repeat thyroid bed ultrasound is negative 2023 -    2. Postsurgical Hypothyroidism :      -TSH elevated -No changes at this time, but she was advised to use a pillbox and we will recheck in 2 months  Continue levothyroxine 100 mcg daily       F/U in 1 yr    Signed electronically by: Lyndle Herrlich, MD  [facility]  [facility] [address] [place] [place], [place] [address] Phone: [phone] FAX: [phone]      CC: Blenda Mounts, MD [address] [place] [address] Phone: [phone]  Fax: [phone]   Return to Endocrinology clinic as below: Future Appointments  Date Time Provider Department Center  04/19/2023  8:50 AM Roy Tokarz, Konrad Dolores, MD [facility] None

## 2023-10-23 IMAGING — DX DG CHEST 1V PORT
1 series · 1 of 1 positions shown · non-contrast
Comparison: No priors.

CLINICAL DATA: 35-year-old female with history of fever and nasal
congestion. Chemical inhalational exposure.

EXAM:
PORTABLE CHEST 1 VIEW

[chest ap]
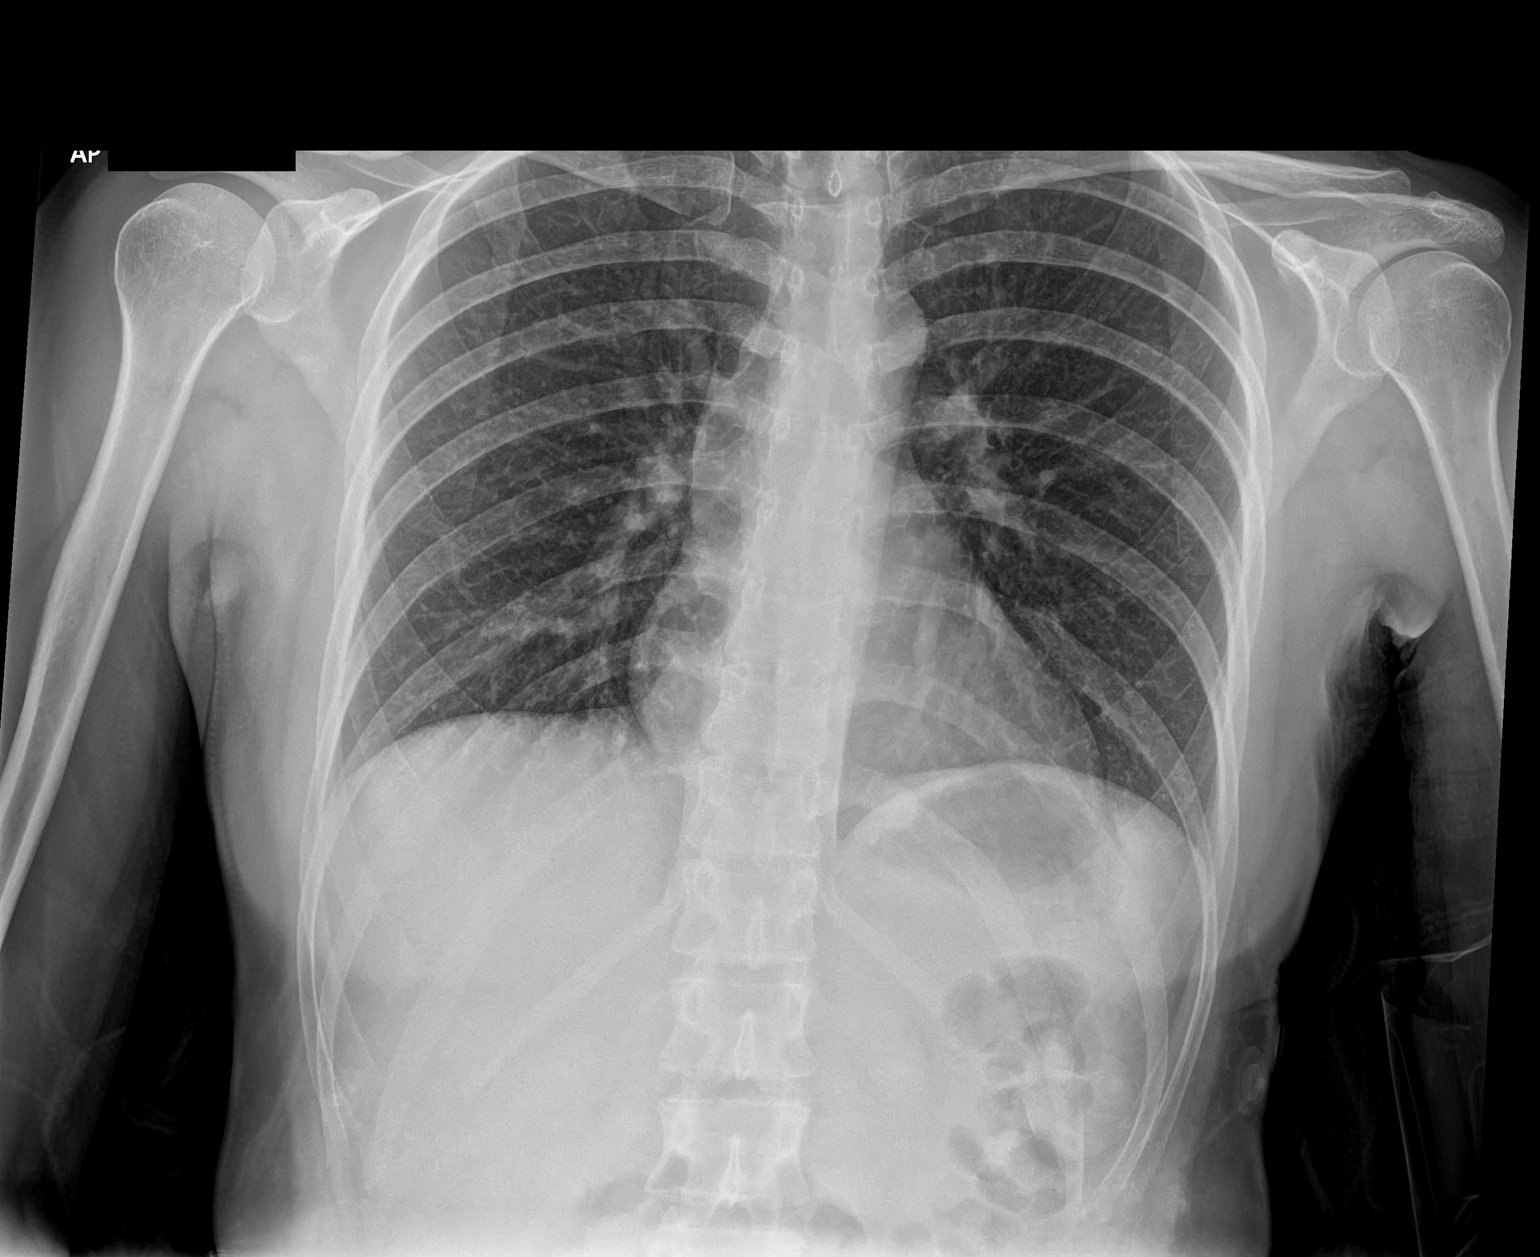

[1 of 1 positions shown; findings below may reference images not displayed]

FINDINGS: Lung volumes are normal. No consolidative airspace disease. No
pleural effusions. No pneumothorax. No pulmonary nodule or mass
noted. Pulmonary vasculature and the cardiomediastinal silhouette
are within normal limits.
IMPRESSION: No radiographic evidence of acute cardiopulmonary disease.
